# Patient Record
Sex: Female | Born: 1975
Health system: Southern US, Community
[De-identification: ages and names within clinical notes are randomized; demographics above are authoritative.]

## PROBLEM LIST (undated history)

## (undated) DIAGNOSIS — Z8619 Personal history of other infectious and parasitic diseases: Secondary | ICD-10-CM

## (undated) DIAGNOSIS — O24419 Gestational diabetes mellitus in pregnancy, unspecified control: Secondary | ICD-10-CM

## (undated) HISTORY — DX: Personal history of other infectious and parasitic diseases: Z86.19

## (undated) HISTORY — DX: Gestational diabetes mellitus in pregnancy, unspecified control: O24.419

## (undated) HISTORY — PX: WISDOM TOOTH EXTRACTION: SHX21

---

## 2007-05-20 ENCOUNTER — Other Ambulatory Visit: Admission: RE | Admit: 2007-05-20 | Discharge: 2007-05-20 | Payer: Self-pay | Admitting: Family Medicine

## 2008-05-20 ENCOUNTER — Other Ambulatory Visit: Admission: RE | Admit: 2008-05-20 | Discharge: 2008-05-20 | Payer: Self-pay | Admitting: Family Medicine

## 2009-05-13 ENCOUNTER — Other Ambulatory Visit: Admission: RE | Admit: 2009-05-13 | Discharge: 2009-05-13 | Payer: Self-pay | Admitting: Family Medicine

## 2010-04-06 ENCOUNTER — Other Ambulatory Visit: Admission: RE | Admit: 2010-04-06 | Discharge: 2010-04-06 | Payer: Self-pay | Admitting: *Deleted

## 2011-01-18 ENCOUNTER — Encounter: Payer: BC Managed Care – PPO | Attending: Obstetrics and Gynecology

## 2011-01-18 DIAGNOSIS — Z713 Dietary counseling and surveillance: Secondary | ICD-10-CM | POA: Insufficient documentation

## 2011-01-18 DIAGNOSIS — O99810 Abnormal glucose complicating pregnancy: Secondary | ICD-10-CM | POA: Insufficient documentation

## 2011-01-26 ENCOUNTER — Ambulatory Visit: Payer: Self-pay | Admitting: Dietician

## 2011-02-15 ENCOUNTER — Inpatient Hospital Stay (HOSPITAL_COMMUNITY)
Admission: AD | Admit: 2011-02-15 | Discharge: 2011-02-16 | Disposition: A | Discharge status: Home / Self Care | Payer: No Typology Code available for payment source | Source: Ambulatory Visit | Attending: Obstetrics | Admitting: Obstetrics

## 2011-02-15 DIAGNOSIS — O99891 Other specified diseases and conditions complicating pregnancy: Secondary | ICD-10-CM | POA: Insufficient documentation

## 2011-02-15 LAB — KLEIHAUER-BETKE STAIN
# Vials RhIg: 1
Fetal Cells %: 0 %
Quantitation Fetal Hemoglobin: 0 mL

## 2011-02-15 LAB — GLUCOSE, CAPILLARY: Glucose-Capillary: 94 mg/dL (ref 70–99)

## 2011-02-17 LAB — RH IMMUNE GLOBULIN WORKUP (NOT WOMEN'S HOSP)
ABO/RH(D): O NEG
Unit division: 0

## 2011-03-30 ENCOUNTER — Inpatient Hospital Stay (HOSPITAL_COMMUNITY)
Admission: RE | Admit: 2011-03-30 | Discharge: 2011-04-01 | DRG: 372 | Disposition: A | Discharge status: Home / Self Care | Payer: BC Managed Care – PPO | Source: Ambulatory Visit | Attending: Obstetrics and Gynecology | Admitting: Obstetrics and Gynecology

## 2011-03-30 DIAGNOSIS — O99814 Abnormal glucose complicating childbirth: Principal | ICD-10-CM | POA: Diagnosis present

## 2011-03-30 LAB — CBC
Hemoglobin: 11 g/dL — ABNORMAL LOW (ref 12.0–15.0)
MCH: 28 pg (ref 26.0–34.0)
Platelets: 250 10*3/uL (ref 150–400)
RBC: 3.93 MIL/uL (ref 3.87–5.11)
WBC: 8.7 10*3/uL (ref 4.0–10.5)

## 2011-03-30 LAB — GLUCOSE, CAPILLARY
Glucose-Capillary: 89 mg/dL (ref 70–99)
Glucose-Capillary: 81 mg/dL (ref 70–99)
Glucose-Capillary: 206 mg/dL — ABNORMAL HIGH (ref 70–99)

## 2011-03-30 LAB — RPR: RPR Ser Ql: NONREACTIVE

## 2011-03-31 LAB — GLUCOSE, CAPILLARY
Glucose-Capillary: 76 mg/dL (ref 70–99)
Glucose-Capillary: 187 mg/dL — ABNORMAL HIGH (ref 70–99)
Glucose-Capillary: 145 mg/dL — ABNORMAL HIGH (ref 70–99)

## 2011-03-31 LAB — CBC
MCH: 28.3 pg (ref 26.0–34.0)
MCHC: 32.4 g/dL (ref 30.0–36.0)
MCV: 87.4 fL (ref 78.0–100.0)
Platelets: 248 10*3/uL (ref 150–400)
RBC: 3.64 MIL/uL — ABNORMAL LOW (ref 3.87–5.11)

## 2011-04-01 LAB — RH IMMUNE GLOB WKUP(>/=20WKS)(NOT WOMEN'S HOSP)

## 2011-04-01 LAB — GLUCOSE, CAPILLARY: Glucose-Capillary: 81 mg/dL (ref 70–99)

## 2011-04-06 ENCOUNTER — Inpatient Hospital Stay (HOSPITAL_COMMUNITY): Admission: AD | Admit: 2011-04-06 | Payer: Self-pay | Admitting: Obstetrics and Gynecology

## 2011-04-07 NOTE — H&P (Signed)
  NAMEFRANKLIN, CLAPSADDLE NO.:  [id number]  MEDICAL RECORD NO.:  [id number]  LOCATION:  9133                          FACILITY:  [facility]  PHYSICIAN:  [name], M.D.DATE OF BIRTH:  1976/07/08  DATE OF ADMISSION:  03/30/2011 DATE OF DISCHARGE:                             HISTORY & PHYSICAL   CHIEF COMPLAINT:  Class A2 diabetic mellitus, induction of __________.  HISTORY OF PRESENT ILLNESS:  A 36 year old white female with P0 at 63 weeks' gestation.  Glyburide dependent.  Gestational diabetes.  Now 39 weeks for induction of labor with favorable cervix.  She has no known drug allergies.  No known latex allergies.  Medications are prenatal vitamins and glyburide.  SOCIAL HISTORY:  She is a nonsmoker, nondrinker.  She denies domestic or physical violence.  FAMILY HISTORY:  Migraine headaches, heart disease, MI.  This is her first pregnancy.  PHYSICAL EXAMINATION:  GENERAL:  A well-developed and well-nourished white female in no acute distress. HEENT:  Normal. NECK:  Supple.  Full range of motion. LUNGS:  Clear. HEART:  Regular rate and rhythm. ABDOMEN:  Soft, gravid, and nontender.  Estimated fetal weight 7 pounds. Cervix is 2-3 cm, 100% vertex, -1. EXTREMITIES:  There are no cords. NEUROLOGIC:  Nonfocal. SKIN:  Intact.  IMPRESSION: 1. A 39 weeks' intrauterine pregnancy. 2. Class A2 diabetes mellitus, on glyburide, glucose 84 this morning.  PLAN:  To admit, Pitocin, epidural p.r.n., anticipate cautious attempts at vaginal delivery.     [name], M.D.    RJT/MEDQ  D:  03/30/2011  T:  03/31/2011  Job:  [id number]  Electronically Signed by Olivia Mackie M.D. on 04/07/2011 07:37:56 AM

## 2011-04-13 ENCOUNTER — Ambulatory Visit (HOSPITAL_COMMUNITY): Admission: RE | Admit: 2011-04-13 | Payer: BC Managed Care – PPO | Source: Ambulatory Visit

## 2012-05-29 LAB — OB RESULTS CONSOLE ABO/RH: RH Type: NEGATIVE

## 2012-05-29 LAB — OB RESULTS CONSOLE GC/CHLAMYDIA: Chlamydia: NEGATIVE

## 2012-05-29 LAB — OB RESULTS CONSOLE RPR: RPR: NONREACTIVE

## 2012-05-29 LAB — OB RESULTS CONSOLE ANTIBODY SCREEN: Antibody Screen: POSITIVE

## 2012-05-29 LAB — OB RESULTS CONSOLE HIV ANTIBODY (ROUTINE TESTING): HIV: NONREACTIVE

## 2012-05-29 LAB — OB RESULTS CONSOLE RUBELLA ANTIBODY, IGM: Rubella: IMMUNE

## 2012-05-29 LAB — OB RESULTS CONSOLE HEPATITIS B SURFACE ANTIGEN: Hepatitis B Surface Ag: NEGATIVE

## 2012-10-02 NOTE — L&D Delivery Note (Signed)
Delivery Note At  a viable and healthy female was delivered via  (Presentation: LOA  ).  APGAR: 9, 9; weight pending.   Placenta status: spontaneous, intact .  Cord:  with the following complications: none .  Cord pH: na  Anesthesia: epidural  Episiotomy: none Lacerations: second degree Suture Repair: 2.0 vicryl rapide Est. Blood Loss (mL): 300  Mom to postpartum.  Baby to nursery-stable.  Osvaldo Lamping J 12/25/2012, 4:04 PM

## 2012-11-26 LAB — OB RESULTS CONSOLE GBS: GBS: POSITIVE

## 2012-12-12 ENCOUNTER — Other Ambulatory Visit: Payer: Self-pay | Admitting: Obstetrics and Gynecology

## 2012-12-17 ENCOUNTER — Telehealth (HOSPITAL_COMMUNITY): Payer: Self-pay | Admitting: *Deleted

## 2012-12-17 ENCOUNTER — Encounter (HOSPITAL_COMMUNITY): Payer: Self-pay | Admitting: *Deleted

## 2012-12-17 NOTE — Telephone Encounter (Signed)
Preadmission screen  

## 2012-12-21 ENCOUNTER — Encounter (HOSPITAL_COMMUNITY): Payer: Self-pay | Admitting: *Deleted

## 2012-12-21 ENCOUNTER — Inpatient Hospital Stay (HOSPITAL_COMMUNITY)
Admission: AD | Admit: 2012-12-21 | Discharge: 2012-12-21 | Disposition: A | Discharge status: Home / Self Care | Payer: 59 | Source: Ambulatory Visit | Attending: Obstetrics and Gynecology | Admitting: Obstetrics and Gynecology

## 2012-12-21 DIAGNOSIS — O99810 Abnormal glucose complicating pregnancy: Secondary | ICD-10-CM | POA: Insufficient documentation

## 2012-12-21 DIAGNOSIS — O368190 Decreased fetal movements, unspecified trimester, not applicable or unspecified: Secondary | ICD-10-CM | POA: Insufficient documentation

## 2012-12-21 NOTE — Progress Notes (Signed)
Written and verbal d/c instructions given and understanding voiced. 

## 2012-12-21 NOTE — MAU Provider Note (Signed)
  History   Decreased FM and ? contractions  CSN: [id number]  Arrival date and time: 12/21/12 0230   None     Chief Complaint  Patient presents with  . Decreased Fetal Movement   HPI  OB History   Grav Para Term Preterm Abortions TAB SAB Ect Mult Living   2 1 1       1       Past Medical History  Diagnosis Date  . Hx of varicella   . Gestational diabetes     Past Surgical History  Procedure Laterality Date  . Wisdom tooth extraction      Family History  Problem Relation Age of Onset  . Heart murmur Mother   . Heart disease Mother   . Migraines Mother   . Alcohol abuse Father   . Dementia Maternal Grandmother   . Heart disease Maternal Grandfather   . Heart attack Maternal Grandfather   . Cancer Paternal Grandmother     skin and leukemia    History  Substance Use Topics  . Smoking status: Never Smoker   . Smokeless tobacco: Never Used  . Alcohol Use: No    Allergies: No Known Allergies  No prescriptions prior to admission    ROS Physical Exam VE: per Rn No change   Blood pressure 106/71, pulse 74, temperature 96.6 F (35.9 C), temperature source Oral, resp. rate 20, height 5\' 3"  (1.6 m), weight 72.303 kg (159 lb 6.4 oz), last menstrual period 03/23/2012.  Physical Exam  MAU Course  Procedures NST reactive, category I, no decels, rare contractions.  MDM na  Assessment and Plan  39 weeks Decreased FM with reactive NST GDM- stable DC home FAC and labor precautions.  Karen Ortiz J 12/21/2012, 7:57 AM

## 2012-12-21 NOTE — Treatment Plan (Signed)
Dr. Billy Coast notified of pt. Complaint, fetal heart tracing, gestational age and blood glucose levels. Orders received for SVE and if still 2-3 can go home.

## 2012-12-21 NOTE — MAU Note (Signed)
Pt. 39.0 weeks here complaining of noted decreased fetal movement and an elevation in blood glucose. Woke up around 1am and noticed a decrease in baby movement then checked her bs and it was 131 at that time. Pt. Rechecked it later and it was 117. Then rechecked it later and it 99.

## 2012-12-25 ENCOUNTER — Inpatient Hospital Stay (HOSPITAL_COMMUNITY)
Admission: RE | Admit: 2012-12-25 | Discharge: 2012-12-27 | DRG: 775 | Disposition: A | Discharge status: Home / Self Care | Payer: 59 | Source: Ambulatory Visit | Attending: Obstetrics and Gynecology | Admitting: Obstetrics and Gynecology

## 2012-12-25 ENCOUNTER — Encounter (HOSPITAL_COMMUNITY): Payer: Self-pay | Admitting: Anesthesiology

## 2012-12-25 ENCOUNTER — Inpatient Hospital Stay (HOSPITAL_COMMUNITY): Payer: 59 | Admitting: Anesthesiology

## 2012-12-25 ENCOUNTER — Encounter (HOSPITAL_COMMUNITY): Payer: Self-pay

## 2012-12-25 DIAGNOSIS — O09529 Supervision of elderly multigravida, unspecified trimester: Secondary | ICD-10-CM | POA: Diagnosis present

## 2012-12-25 DIAGNOSIS — D649 Anemia, unspecified: Secondary | ICD-10-CM | POA: Diagnosis not present

## 2012-12-25 DIAGNOSIS — J329 Chronic sinusitis, unspecified: Secondary | ICD-10-CM | POA: Diagnosis present

## 2012-12-25 DIAGNOSIS — O9903 Anemia complicating the puerperium: Secondary | ICD-10-CM | POA: Diagnosis not present

## 2012-12-25 DIAGNOSIS — O99814 Abnormal glucose complicating childbirth: Principal | ICD-10-CM | POA: Diagnosis present

## 2012-12-25 DIAGNOSIS — O99892 Other specified diseases and conditions complicating childbirth: Secondary | ICD-10-CM | POA: Diagnosis present

## 2012-12-25 LAB — RPR: RPR Ser Ql: NONREACTIVE

## 2012-12-25 LAB — CBC
HCT: 30.5 % — ABNORMAL LOW (ref 36.0–46.0)
Hemoglobin: 10.2 g/dL — ABNORMAL LOW (ref 12.0–15.0)
WBC: 7.1 10*3/uL (ref 4.0–10.5)

## 2012-12-25 LAB — GLUCOSE, CAPILLARY: Glucose-Capillary: 64 mg/dL — ABNORMAL LOW (ref 70–99)

## 2012-12-25 LAB — GLUCOSE, RANDOM: Glucose, Bld: 106 mg/dL — ABNORMAL HIGH (ref 70–99)

## 2012-12-25 MED ORDER — DIBUCAINE 1 % RE OINT: 1.0000 "application " | TOPICAL_OINTMENT | RECTAL | Status: DC | PRN

## 2012-12-25 MED ORDER — OXYCODONE-ACETAMINOPHEN 5-325 MG PO TABS: 1.0000 | ORAL_TABLET | ORAL | Status: DC | PRN

## 2012-12-25 MED ORDER — BENZOCAINE-MENTHOL 20-0.5 % EX AERO
1.0000 "application " | INHALATION_SPRAY | CUTANEOUS | Status: DC | PRN
  Administered 2012-12-25: 1 via TOPICAL
  Filled 2012-12-25: qty 56

## 2012-12-25 MED ORDER — EPHEDRINE 5 MG/ML INJ
10.0000 mg | INTRAVENOUS | Status: DC | PRN
  Filled 2012-12-25: qty 2

## 2012-12-25 MED ORDER — LIDOCAINE HCL (PF) 1 % IJ SOLN
30.0000 mL | INTRAMUSCULAR | Status: DC | PRN
  Filled 2012-12-25: qty 30

## 2012-12-25 MED ORDER — PENICILLIN G POTASSIUM 5000000 UNITS IJ SOLR
5.0000 10*6.[IU] | Freq: Once | INTRAVENOUS | Status: DC
  Filled 2012-12-25: qty 5

## 2012-12-25 MED ORDER — PENICILLIN G POTASSIUM 5000000 UNITS IJ SOLR
2.5000 10*6.[IU] | INTRAVENOUS | Status: DC
  Filled 2012-12-25 (×2): qty 2.5

## 2012-12-25 MED ORDER — ONDANSETRON HCL 4 MG/2ML IJ SOLN: 4.0000 mg | INTRAMUSCULAR | Status: DC | PRN

## 2012-12-25 MED ORDER — LACTATED RINGERS IV SOLN: 500.0000 mL | Freq: Once | INTRAVENOUS | Status: DC

## 2012-12-25 MED ORDER — CITRIC ACID-SODIUM CITRATE 334-500 MG/5ML PO SOLN: 30.0000 mL | ORAL | Status: DC | PRN

## 2012-12-25 MED ORDER — TERBUTALINE SULFATE 1 MG/ML IJ SOLN: 0.2500 mg | Freq: Once | INTRAMUSCULAR | Status: DC | PRN

## 2012-12-25 MED ORDER — EPHEDRINE 5 MG/ML INJ
10.0000 mg | INTRAVENOUS | Status: DC | PRN
  Filled 2012-12-25: qty 2
  Filled 2012-12-25: qty 4

## 2012-12-25 MED ORDER — ONDANSETRON HCL 4 MG PO TABS: 4.0000 mg | ORAL_TABLET | ORAL | Status: DC | PRN

## 2012-12-25 MED ORDER — OXYTOCIN BOLUS FROM INFUSION: 500.0000 mL | INTRAVENOUS | Status: DC

## 2012-12-25 MED ORDER — PENICILLIN G POTASSIUM 5000000 UNITS IJ SOLR
5.0000 10*6.[IU] | Freq: Once | INTRAVENOUS | Status: AC
  Administered 2012-12-25: 5 10*6.[IU] via INTRAVENOUS
  Filled 2012-12-25: qty 5

## 2012-12-25 MED ORDER — ZOLPIDEM TARTRATE 5 MG PO TABS: 5.0000 mg | ORAL_TABLET | Freq: Every evening | ORAL | Status: DC | PRN

## 2012-12-25 MED ORDER — OXYTOCIN 40 UNITS IN LACTATED RINGERS INFUSION - SIMPLE MED
1.0000 m[IU]/min | INTRAVENOUS | Status: DC
  Administered 2012-12-25: 12 m[IU]/min via INTRAVENOUS
  Administered 2012-12-25: 2 m[IU]/min via INTRAVENOUS
  Filled 2012-12-25: qty 1000

## 2012-12-25 MED ORDER — SENNOSIDES-DOCUSATE SODIUM 8.6-50 MG PO TABS
2.0000 | ORAL_TABLET | Freq: Every day | ORAL | Status: DC
  Administered 2012-12-25 – 2012-12-26 (×2): 2 via ORAL

## 2012-12-25 MED ORDER — TETANUS-DIPHTH-ACELL PERTUSSIS 5-2.5-18.5 LF-MCG/0.5 IM SUSP
0.5000 mL | Freq: Once | INTRAMUSCULAR | Status: AC
  Administered 2012-12-27: 0.5 mL via INTRAMUSCULAR
  Filled 2012-12-25 (×2): qty 0.5

## 2012-12-25 MED ORDER — LACTATED RINGERS IV SOLN
INTRAVENOUS | Status: DC
  Administered 2012-12-25: 900 mL via INTRAVENOUS
  Administered 2012-12-25: 1000 mL via INTRAVENOUS

## 2012-12-25 MED ORDER — PHENYLEPHRINE 40 MCG/ML (10ML) SYRINGE FOR IV PUSH (FOR BLOOD PRESSURE SUPPORT)
80.0000 ug | PREFILLED_SYRINGE | INTRAVENOUS | Status: DC | PRN
  Filled 2012-12-25: qty 2

## 2012-12-25 MED ORDER — BUTORPHANOL TARTRATE 1 MG/ML IJ SOLN: 1.0000 mg | INTRAMUSCULAR | Status: DC | PRN

## 2012-12-25 MED ORDER — FLEET ENEMA 7-19 GM/118ML RE ENEM: 1.0000 | ENEMA | RECTAL | Status: DC | PRN

## 2012-12-25 MED ORDER — SIMETHICONE 80 MG PO CHEW: 80.0000 mg | CHEWABLE_TABLET | ORAL | Status: DC | PRN

## 2012-12-25 MED ORDER — DIPHENHYDRAMINE HCL 25 MG PO CAPS: 25.0000 mg | ORAL_CAPSULE | Freq: Four times a day (QID) | ORAL | Status: DC | PRN

## 2012-12-25 MED ORDER — PHENYLEPHRINE 40 MCG/ML (10ML) SYRINGE FOR IV PUSH (FOR BLOOD PRESSURE SUPPORT)
80.0000 ug | PREFILLED_SYRINGE | INTRAVENOUS | Status: DC | PRN
  Filled 2012-12-25: qty 2
  Filled 2012-12-25: qty 5

## 2012-12-25 MED ORDER — LANOLIN HYDROUS EX OINT: TOPICAL_OINTMENT | CUTANEOUS | Status: DC | PRN

## 2012-12-25 MED ORDER — SODIUM BICARBONATE 8.4 % IV SOLN
INTRAVENOUS | Status: DC | PRN
  Administered 2012-12-25: 5 mL via EPIDURAL

## 2012-12-25 MED ORDER — ONDANSETRON HCL 4 MG/2ML IJ SOLN: 4.0000 mg | Freq: Four times a day (QID) | INTRAMUSCULAR | Status: DC | PRN

## 2012-12-25 MED ORDER — PENICILLIN G POTASSIUM 5000000 UNITS IJ SOLR
2.5000 10*6.[IU] | INTRAVENOUS | Status: DC
  Administered 2012-12-25: 2.5 10*6.[IU] via INTRAVENOUS
  Filled 2012-12-25 (×5): qty 2.5

## 2012-12-25 MED ORDER — METHYLERGONOVINE MALEATE 0.2 MG PO TABS: 0.2000 mg | ORAL_TABLET | ORAL | Status: DC | PRN

## 2012-12-25 MED ORDER — PRENATAL MULTIVITAMIN CH
1.0000 | ORAL_TABLET | Freq: Every day | ORAL | Status: DC
  Administered 2012-12-26: 1 via ORAL
  Filled 2012-12-25: qty 1

## 2012-12-25 MED ORDER — DIPHENHYDRAMINE HCL 50 MG/ML IJ SOLN: 12.5000 mg | INTRAMUSCULAR | Status: DC | PRN

## 2012-12-25 MED ORDER — IBUPROFEN 600 MG PO TABS
600.0000 mg | ORAL_TABLET | Freq: Four times a day (QID) | ORAL | Status: DC
  Administered 2012-12-25 – 2012-12-27 (×7): 600 mg via ORAL
  Filled 2012-12-25 (×6): qty 1

## 2012-12-25 MED ORDER — FENTANYL 2.5 MCG/ML BUPIVACAINE 1/10 % EPIDURAL INFUSION (WH - ANES)
14.0000 mL/h | INTRAMUSCULAR | Status: DC | PRN
  Administered 2012-12-25: 14 mL/h via EPIDURAL
  Filled 2012-12-25: qty 125

## 2012-12-25 MED ORDER — WITCH HAZEL-GLYCERIN EX PADS: 1.0000 "application " | MEDICATED_PAD | CUTANEOUS | Status: DC | PRN

## 2012-12-25 MED ORDER — ACETAMINOPHEN 325 MG PO TABS: 650.0000 mg | ORAL_TABLET | ORAL | Status: DC | PRN

## 2012-12-25 MED ORDER — LACTATED RINGERS IV SOLN
500.0000 mL | INTRAVENOUS | Status: DC | PRN
  Administered 2012-12-25: 1000 mL via INTRAVENOUS

## 2012-12-25 MED ORDER — METHYLERGONOVINE MALEATE 0.2 MG/ML IJ SOLN: 0.2000 mg | INTRAMUSCULAR | Status: DC | PRN

## 2012-12-25 MED ORDER — IBUPROFEN 600 MG PO TABS: 600.0000 mg | ORAL_TABLET | Freq: Four times a day (QID) | ORAL | Status: DC | PRN

## 2012-12-25 MED ORDER — OXYCODONE-ACETAMINOPHEN 5-325 MG PO TABS
1.0000 | ORAL_TABLET | ORAL | Status: DC | PRN
  Administered 2012-12-25 – 2012-12-26 (×2): 1 via ORAL
  Filled 2012-12-25 (×2): qty 1

## 2012-12-25 MED ORDER — OXYTOCIN 40 UNITS IN LACTATED RINGERS INFUSION - SIMPLE MED: 62.5000 mL/h | INTRAVENOUS | Status: DC

## 2012-12-25 NOTE — Anesthesia Preprocedure Evaluation (Signed)
Anesthesia Evaluation  Patient identified by MRN, date of birth, ID band Patient awake    Reviewed: Allergy & Precautions, H&P , Patient's Chart, lab work & pertinent test results  Airway Mallampati: II TM Distance: >3 FB Neck ROM: full    Dental  (+) Teeth Intact   Pulmonary  breath sounds clear to auscultation        Cardiovascular Rhythm:regular Rate:Normal     Neuro/Psych    GI/Hepatic   Endo/Other  diabetes, Gestational  Renal/GU      Musculoskeletal   Abdominal   Peds  Hematology   Anesthesia Other Findings       Reproductive/Obstetrics (+) Pregnancy                           Anesthesia Physical Anesthesia Plan  ASA: III  Anesthesia Plan: Epidural   Post-op Pain Management:    Induction:   Airway Management Planned:   Additional Equipment:   Intra-op Plan:   Post-operative Plan:   Informed Consent:   Plan Discussed with:   Anesthesia Plan Comments:         Anesthesia Quick Evaluation

## 2012-12-25 NOTE — Progress Notes (Signed)
Karen Ortiz is a 37 y.o. G2P1001 at [redacted]w[redacted]d by LMP admitted for induction of labor due to Gestational diabetes.  Subjective: comfortable  Objective: LMP 03/23/2012      FHT:  FHR: 155 bpm, variability: moderate,  accelerations:  Present,  decelerations:  Absent UC:   irregular, every 10 minutes SVE:    3/70/-1 AROM- clear  Labs: Lab Results  Component Value Date   WBC 14.9* 03/31/2011   HGB 10.3* 03/31/2011   HCT 31.8* 03/31/2011   MCV 87.4 03/31/2011   PLT 248 03/31/2011    Assessment / Plan: Induction of labor due to gestational diabetes,  progressing well on pitocin  Labor: Progressing on Pitocin, will continue to increase then AROM Preeclampsia:  na Fetal Wellbeing:  Category I Pain Control:  Labor support without medications I/D:  n/a Anticipated MOD:  NSVD BS pending  Sayler Mickiewicz J 12/25/2012, 7:14 AM

## 2012-12-25 NOTE — Anesthesia Procedure Notes (Signed)

## 2012-12-25 NOTE — Progress Notes (Signed)
Karen Ortiz is a 37 y.o. G2P1001 at [redacted]w[redacted]d by LMP admitted for induction of labor due to Gestational diabetes.  Subjective: Feels pressure  Objective: BP 102/59  Pulse 64  Temp(Src) 98.1 F (36.7 C) (Oral)  Resp 18  Ht 5\' 3"  (1.6 m)  Wt 72.122 kg (159 lb)  BMI 28.17 kg/m2  SpO2 100%  LMP 03/23/2012      FHT:  FHR: 155 bpm, variability: moderate,  accelerations:  Present,  decelerations:  Absent UC:   irregular, every 4 minutes SVE:   Dilation: 10 Effacement (%): 100 Station: +3 Exam by:: Dr Billy Coast BS 66  Labs: Lab Results  Component Value Date   WBC 7.1 12/25/2012   HGB 10.2* 12/25/2012   HCT 30.5* 12/25/2012   MCV 87.9 12/25/2012   PLT 208 12/25/2012    Assessment / Plan: Induction of labor due to gestational diabetes,  progressing well on pitocin  Labor: Progressing normally Preeclampsia:  na Fetal Wellbeing:  Category I Pain Control:  Epidural I/D:  n/a Anticipated MOD:  NSVD  Karen Ortiz J 12/25/2012, 3:30 PM

## 2012-12-26 LAB — CBC
Hemoglobin: 9.7 g/dL — ABNORMAL LOW (ref 12.0–15.0)
MCH: 29.1 pg (ref 26.0–34.0)
MCV: 88 fL (ref 78.0–100.0)
RBC: 3.33 MIL/uL — ABNORMAL LOW (ref 3.87–5.11)

## 2012-12-26 MED ORDER — RHO D IMMUNE GLOBULIN 1500 UNIT/2ML IJ SOLN
300.0000 ug | Freq: Once | INTRAMUSCULAR | Status: AC
  Administered 2012-12-26: 300 ug via INTRAMUSCULAR
  Filled 2012-12-26: qty 2

## 2012-12-26 NOTE — Progress Notes (Signed)
Patient ID: Karen Ortiz, female   DOB: 1976-02-05, 37 y.o.   MRN: [id number] PPD # 1 S/P SVD w/ 2nd deg repair  Subjective: Pt reports feeling well/ Pain controlled with ibuprofen Tolerating po/ Voiding without problems/ No n/v Bleeding is light Newborn info:  Information for the patient's newborn:  Karen, Ortiz [[id number]]  female  / circ planned for today, per Dr Billy Coast Feeding: breast   Objective:  VS: Blood pressure 116/58, pulse 72, temperature 98.1 F (36.7 C), temperature source Oral, resp. rate 20.    Recent Labs  12/25/12 0745 12/26/12 0630  WBC 7.1 12.1*  HGB 10.2* 9.7*  HCT 30.5* 29.3*  PLT 208 207    Blood type: O/Negative/-- (08/28 0000)   Infant: O Neg Rubella: Immune (08/28 0000)    Physical Exam:  General:  alert, cooperative and no distress CV: Regular rate and rhythm Resp: clear Abdomen: soft, nontender, normal bowel sounds Uterine Fundus: firm, below umbilicus, nontender Perineum: healing with good reapproximation Lochia: minimal Ext: edema trace to +1 and Homans sign is negative, no sign of DVT   A/P: PPD # 1/ G3P2002/ S/P:  SVD w/ 2nd deg repair Hx GDM A1, well controlled, resolving Doing well Continue routine post partum orders Anticipate D/C home in AM    Karen Revel, MSN, [facility] 12/26/2012, 9:27 AM

## 2012-12-26 NOTE — H&P (Signed)
Karen Ortiz, Karen Ortiz NO.:  [id number]  MEDICAL RECORD NO.:  [id number]  LOCATION:  9108                          FACILITY:  [facility]  PHYSICIAN:  [name], M.D.DATE OF BIRTH:  Aug 18, 1976  DATE OF ADMISSION:  12/25/2012 DATE OF DISCHARGE:                             HISTORY & PHYSICAL   CHIEF COMPLAINT:  Gestational diabetes for induction at 39-1/2 weeks.  She is a 37 year old white female, G2, P1, at 39 weeks and 4/7th days with well controlled gestational diabetes for induction at term.  She has no known drug allergies.  MEDICATIONS:  Prenatal vitamins.  She is a nonsmoker, nondrinker.  She denies domestic or physical violence.  Her social history is otherwise noncontributory.  Her pregnancy complicated by gestational diabetes, which was diet controlled.  She has a family history of migraine headaches, dementia, heart disease, and heart murmur.  She had a previous vaginal delivery of a 6 pounds 6 ounces female.  She had wisdom tooth extraction and has not had any surgical procedure.  PHYSICAL EXAMINATION:  GENERAL:  She is a well-developed, well- nourished, white female, in no acute distress. HEENT:  Normal. NECK:  Supple.  Full range of motion. LUNGS:  Clear. HEART:  Regular rhythm. ABDOMEN:  Soft, gravid, nontender.  Estimated fetal weight 7 pounds. Cervix is 3-4 cm, 80%, vertex, -1. EXTREMITIES:  There are no cords. NEUROLOGIC:  Nonfocal. SKIN:  Intact.  IMPRESSION:  A 39-week and 4/7th day intrauterine gestation with gestational diabetes, well controlled on diet for induction.  PLAN:  Pitocin and epidural as needed.  Anticipate attempts at vaginal delivery.     [name], M.D.     RJT/MEDQ  D:  12/25/2012  T:  12/26/2012  Job:  [id number]

## 2012-12-27 LAB — RH IG WORKUP (INCLUDES ABO/RH)
Antibody Screen: POSITIVE
Fetal Screen: NEGATIVE
Gestational Age(Wks): 39.4

## 2012-12-27 MED ORDER — IBUPROFEN 600 MG PO TABS: 600.0000 mg | ORAL_TABLET | Freq: Four times a day (QID) | ORAL | Status: AC

## 2012-12-27 MED ORDER — AZITHROMYCIN 250 MG PO TABS: ORAL_TABLET | ORAL | Status: DC

## 2012-12-27 MED ORDER — POLYSACCHARIDE IRON COMPLEX 150 MG PO CAPS: 150.0000 mg | ORAL_CAPSULE | Freq: Two times a day (BID) | ORAL | Status: DC

## 2012-12-27 NOTE — Progress Notes (Signed)
PPD 2 SVD  S:  Reports feeling stressed out today - spouse sick with GI virus             URI "cold" for 2 weeks - worse in past 3 days / green drainage and feels miserable / requesting Zpak             Tolerating po/ No nausea or vomiting             Bleeding is light             Pain controlled with motrin only             Up ad lib / ambulatory / voiding QS  Newborn breast feeding  / Circumcision done O:               VS: BP 92/52  Pulse 49  Temp(Src) 97.7 F (36.5 C) (Oral)  Resp 18  Ht 5\' 3"  (1.6 m)  Wt 72.122 kg (159 lb)  BMI 28.17 kg/m2  SpO2 100%  LMP 03/23/2012   LABS:  Recent Labs  12/25/12 0745 12/26/12 0630  WBC 7.1 12.1*  HGB 10.2* 9.7*  PLT 208 207                   Physical Exam:             Alert and oriented X3  Lungs: Clear and unlabored  Heart: regular rate and rhythm / no mumurs  Abdomen: soft, non-tender, non-distended              Fundus: firm, non-tender, U-2  Perineum: no edema  Lochia: light  Extremities: no edema, no calf pain or tenderness    A: PPD # 2              URI - likely sinusitis             IDA anemia  Doing well - stable status  P:  Routine post partum orders             z-pak / Childrens Dimetapp (combination decongestant and antihistamine) / Muccinex / increase water hydration  Discharge to home - WOB booklet / instructions reviewed  Marlinda Mike CNM, MSN 12/27/2012, 10:32 AM

## 2012-12-27 NOTE — Discharge Summary (Signed)
Obstetric Discharge Summary  Reason for Admission: induction of labor- GDMA1 Prenatal Procedures: none Intrapartum Procedures: spontaneous vaginal delivery and GBS prophylaxis Postpartum Procedures: none Complications-Operative and Postpartum: 2nd degree perineal laceration Hemoglobin  Date Value Range Status  12/26/2012 9.7* 12.0 - 15.0 g/dL Final     HCT  Date Value Range Status  12/26/2012 29.3* 36.0 - 46.0 % Final    Physical Exam:  General: alert, cooperative and no distress Lochia: appropriate Uterine Fundus: firm Incision: healing well DVT Evaluation: No evidence of DVT seen on physical exam.  Discharge Diagnoses: Term Pregnancy-delivered and GDMA1-delivered and IDA anemia of pregnancy-delivered  Discharge Information: Date: 12/27/2012 Activity: pelvic rest Diet: routine Medications: PNV, Ibuprofen and Iron Condition: stable Instructions: refer to practice specific booklet Discharge to: home Follow-up Information   Follow up with Karen Aden, MD. Schedule an appointment as soon as possible for a visit in 6 weeks.   Contact information:   Karen Ortiz Karen Ortiz [place] [address] [phone]       SUGAR test for diabetes outside of pregnancy at 6-12 weeks postpartum  Newborn Data: Live born female  Birth Weight: 6 lb 14 oz (3118 g) APGAR: 9,   Home with mother.  Karen Ortiz 12/27/2012, 10:38 AM

## 2012-12-31 ENCOUNTER — Encounter (HOSPITAL_COMMUNITY): Payer: Self-pay

## 2014-01-12 ENCOUNTER — Ambulatory Visit: Payer: Self-pay | Admitting: Podiatry

## 2014-01-22 ENCOUNTER — Ambulatory Visit (INDEPENDENT_AMBULATORY_CARE_PROVIDER_SITE_OTHER): Payer: Managed Care, Other (non HMO)

## 2014-01-22 ENCOUNTER — Encounter: Payer: Self-pay | Admitting: Podiatry

## 2014-01-22 ENCOUNTER — Ambulatory Visit (INDEPENDENT_AMBULATORY_CARE_PROVIDER_SITE_OTHER): Payer: Managed Care, Other (non HMO) | Admitting: Podiatry

## 2014-01-22 VITALS — BP 102/68 | HR 62 | Resp 16 | Ht 64.0 in | Wt 145.0 lb

## 2014-01-22 DIAGNOSIS — Q828 Other specified congenital malformations of skin: Secondary | ICD-10-CM

## 2014-01-22 DIAGNOSIS — M216X9 Other acquired deformities of unspecified foot: Secondary | ICD-10-CM

## 2014-01-22 DIAGNOSIS — M21619 Bunion of unspecified foot: Secondary | ICD-10-CM

## 2014-01-22 NOTE — Progress Notes (Signed)
Subjective:     Patient ID: Karen Ortiz, female   DOB: 06/10/1976, 38 y.o.   MRN: [id number]  HPI patient presents with plantar lesions on both feet that she is concerned about and states that they can and time become painful and bother her. She does not remember any specific change or injury and has had 2 children and the last several years   Review of Systems  All other systems reviewed and are negative.      Objective:   Physical Exam  Nursing note and vitals reviewed. Constitutional: She is oriented to person, place, and time.  Cardiovascular: Intact distal pulses.   Musculoskeletal: Normal range of motion.  Neurological: She is oriented to person, place, and time.  Skin: Skin is warm.   neurovascular status intact with muscle strength adequate and patient found to have keratotic lesion subsecond metatarsal of both feet with lucent cores. Range of motion within normal limits and digits were found to be well perfused with mild discomfort upon palpation noted     Assessment:     Porokeratotic type lesions plantar aspect both feet secondary to foot structure and keratotic lesions around fifth metatarsal heads of both feet    Plan:     H&P and x-rays reviewed. Debrided lesions on both feet and instructed on home treatment herself and if symptoms were to get worse she will reappoint for retreatment

## 2014-01-22 NOTE — Progress Notes (Signed)
   Subjective:    Patient ID: Karen Ortiz, female    DOB: September 04, 1976, 38 y.o.   MRN: [id number]  HPI Comments: i have these spots on the bottom of my feet, sometime is can be discomforting , they have been there for years     Review of Systems     Objective:   Physical Exam        Assessment & Plan:

## 2014-08-03 ENCOUNTER — Encounter: Payer: Self-pay | Admitting: Podiatry

## 2016-02-25 ENCOUNTER — Other Ambulatory Visit: Payer: Self-pay | Admitting: Family Medicine

## 2016-02-25 DIAGNOSIS — Z1231 Encounter for screening mammogram for malignant neoplasm of breast: Secondary | ICD-10-CM

## 2016-04-11 ENCOUNTER — Ambulatory Visit: Payer: Self-pay

## 2016-04-12 ENCOUNTER — Ambulatory Visit
Admission: RE | Admit: 2016-04-12 | Discharge: 2016-04-12 | Disposition: A | Discharge status: Home / Self Care | Payer: BLUE CROSS/BLUE SHIELD | Source: Ambulatory Visit | Attending: Family Medicine | Admitting: Family Medicine

## 2016-04-12 DIAGNOSIS — Z1231 Encounter for screening mammogram for malignant neoplasm of breast: Secondary | ICD-10-CM

## 2016-05-02 DIAGNOSIS — Z23 Encounter for immunization: Secondary | ICD-10-CM | POA: Diagnosis not present

## 2016-05-08 DIAGNOSIS — I83893 Varicose veins of bilateral lower extremities with other complications: Secondary | ICD-10-CM | POA: Diagnosis not present

## 2016-05-08 DIAGNOSIS — I83813 Varicose veins of bilateral lower extremities with pain: Secondary | ICD-10-CM | POA: Diagnosis not present

## 2016-05-23 DIAGNOSIS — F331 Major depressive disorder, recurrent, moderate: Secondary | ICD-10-CM | POA: Diagnosis not present

## 2016-05-24 DIAGNOSIS — R0982 Postnasal drip: Secondary | ICD-10-CM | POA: Diagnosis not present

## 2016-05-24 DIAGNOSIS — K219 Gastro-esophageal reflux disease without esophagitis: Secondary | ICD-10-CM | POA: Insufficient documentation

## 2016-05-24 DIAGNOSIS — J3089 Other allergic rhinitis: Secondary | ICD-10-CM | POA: Diagnosis not present

## 2016-05-26 DIAGNOSIS — F331 Major depressive disorder, recurrent, moderate: Secondary | ICD-10-CM | POA: Diagnosis not present

## 2016-05-26 DIAGNOSIS — F411 Generalized anxiety disorder: Secondary | ICD-10-CM | POA: Diagnosis not present

## 2016-05-26 DIAGNOSIS — F902 Attention-deficit hyperactivity disorder, combined type: Secondary | ICD-10-CM | POA: Diagnosis not present

## 2016-05-30 DIAGNOSIS — F411 Generalized anxiety disorder: Secondary | ICD-10-CM | POA: Diagnosis not present

## 2016-05-30 DIAGNOSIS — F331 Major depressive disorder, recurrent, moderate: Secondary | ICD-10-CM | POA: Diagnosis not present

## 2016-05-30 DIAGNOSIS — F902 Attention-deficit hyperactivity disorder, combined type: Secondary | ICD-10-CM | POA: Diagnosis not present

## 2016-06-06 DIAGNOSIS — I83893 Varicose veins of bilateral lower extremities with other complications: Secondary | ICD-10-CM | POA: Diagnosis not present

## 2016-06-06 DIAGNOSIS — I83813 Varicose veins of bilateral lower extremities with pain: Secondary | ICD-10-CM | POA: Diagnosis not present

## 2016-06-08 DIAGNOSIS — F331 Major depressive disorder, recurrent, moderate: Secondary | ICD-10-CM | POA: Diagnosis not present

## 2016-06-08 DIAGNOSIS — Z23 Encounter for immunization: Secondary | ICD-10-CM | POA: Diagnosis not present

## 2016-06-08 DIAGNOSIS — R5383 Other fatigue: Secondary | ICD-10-CM | POA: Diagnosis not present

## 2016-06-08 DIAGNOSIS — F411 Generalized anxiety disorder: Secondary | ICD-10-CM | POA: Diagnosis not present

## 2016-06-12 DIAGNOSIS — F331 Major depressive disorder, recurrent, moderate: Secondary | ICD-10-CM | POA: Diagnosis not present

## 2016-06-12 DIAGNOSIS — F411 Generalized anxiety disorder: Secondary | ICD-10-CM | POA: Diagnosis not present

## 2016-06-12 DIAGNOSIS — B079 Viral wart, unspecified: Secondary | ICD-10-CM | POA: Diagnosis not present

## 2016-06-12 DIAGNOSIS — F902 Attention-deficit hyperactivity disorder, combined type: Secondary | ICD-10-CM | POA: Diagnosis not present

## 2016-06-12 DIAGNOSIS — L409 Psoriasis, unspecified: Secondary | ICD-10-CM | POA: Diagnosis not present

## 2016-06-23 DIAGNOSIS — F902 Attention-deficit hyperactivity disorder, combined type: Secondary | ICD-10-CM | POA: Diagnosis not present

## 2016-06-23 DIAGNOSIS — F411 Generalized anxiety disorder: Secondary | ICD-10-CM | POA: Diagnosis not present

## 2016-06-23 DIAGNOSIS — F331 Major depressive disorder, recurrent, moderate: Secondary | ICD-10-CM | POA: Diagnosis not present

## 2016-06-26 DIAGNOSIS — I83813 Varicose veins of bilateral lower extremities with pain: Secondary | ICD-10-CM | POA: Diagnosis not present

## 2016-06-26 DIAGNOSIS — I83891 Varicose veins of right lower extremities with other complications: Secondary | ICD-10-CM | POA: Diagnosis not present

## 2016-06-27 DIAGNOSIS — F331 Major depressive disorder, recurrent, moderate: Secondary | ICD-10-CM | POA: Diagnosis not present

## 2016-06-27 DIAGNOSIS — F902 Attention-deficit hyperactivity disorder, combined type: Secondary | ICD-10-CM | POA: Diagnosis not present

## 2016-06-27 DIAGNOSIS — F411 Generalized anxiety disorder: Secondary | ICD-10-CM | POA: Diagnosis not present

## 2016-07-10 DIAGNOSIS — F411 Generalized anxiety disorder: Secondary | ICD-10-CM | POA: Diagnosis not present

## 2016-07-10 DIAGNOSIS — F902 Attention-deficit hyperactivity disorder, combined type: Secondary | ICD-10-CM | POA: Diagnosis not present

## 2016-07-10 DIAGNOSIS — F331 Major depressive disorder, recurrent, moderate: Secondary | ICD-10-CM | POA: Diagnosis not present

## 2016-07-18 DIAGNOSIS — F331 Major depressive disorder, recurrent, moderate: Secondary | ICD-10-CM | POA: Diagnosis not present

## 2016-07-18 DIAGNOSIS — F902 Attention-deficit hyperactivity disorder, combined type: Secondary | ICD-10-CM | POA: Diagnosis not present

## 2016-07-18 DIAGNOSIS — F411 Generalized anxiety disorder: Secondary | ICD-10-CM | POA: Diagnosis not present

## 2016-07-24 DIAGNOSIS — F902 Attention-deficit hyperactivity disorder, combined type: Secondary | ICD-10-CM | POA: Diagnosis not present

## 2016-07-24 DIAGNOSIS — F411 Generalized anxiety disorder: Secondary | ICD-10-CM | POA: Diagnosis not present

## 2016-07-24 DIAGNOSIS — F331 Major depressive disorder, recurrent, moderate: Secondary | ICD-10-CM | POA: Diagnosis not present

## 2016-07-25 DIAGNOSIS — R070 Pain in throat: Secondary | ICD-10-CM | POA: Diagnosis not present

## 2016-07-25 DIAGNOSIS — J310 Chronic rhinitis: Secondary | ICD-10-CM | POA: Diagnosis not present

## 2016-07-25 DIAGNOSIS — R0982 Postnasal drip: Secondary | ICD-10-CM | POA: Diagnosis not present

## 2016-07-25 DIAGNOSIS — J343 Hypertrophy of nasal turbinates: Secondary | ICD-10-CM | POA: Diagnosis not present

## 2016-08-04 DIAGNOSIS — M7711 Lateral epicondylitis, right elbow: Secondary | ICD-10-CM | POA: Diagnosis not present

## 2016-08-16 DIAGNOSIS — F331 Major depressive disorder, recurrent, moderate: Secondary | ICD-10-CM | POA: Diagnosis not present

## 2016-08-16 DIAGNOSIS — F902 Attention-deficit hyperactivity disorder, combined type: Secondary | ICD-10-CM | POA: Diagnosis not present

## 2016-08-16 DIAGNOSIS — F411 Generalized anxiety disorder: Secondary | ICD-10-CM | POA: Diagnosis not present

## 2016-08-17 DIAGNOSIS — M7711 Lateral epicondylitis, right elbow: Secondary | ICD-10-CM | POA: Diagnosis not present

## 2016-08-28 DIAGNOSIS — Z23 Encounter for immunization: Secondary | ICD-10-CM | POA: Diagnosis not present

## 2016-08-29 ENCOUNTER — Ambulatory Visit: Payer: Managed Care, Other (non HMO) | Admitting: Allergy and Immunology

## 2016-09-04 ENCOUNTER — Encounter: Payer: Self-pay | Admitting: Allergy and Immunology

## 2016-09-04 ENCOUNTER — Encounter (INDEPENDENT_AMBULATORY_CARE_PROVIDER_SITE_OTHER): Payer: Self-pay

## 2016-09-04 ENCOUNTER — Ambulatory Visit (INDEPENDENT_AMBULATORY_CARE_PROVIDER_SITE_OTHER): Payer: BLUE CROSS/BLUE SHIELD | Admitting: Allergy and Immunology

## 2016-09-04 VITALS — BP 132/78 | HR 82 | Temp 98.3°F | Resp 16 | Ht 63.0 in | Wt 153.2 lb

## 2016-09-04 DIAGNOSIS — H6983 Other specified disorders of Eustachian tube, bilateral: Secondary | ICD-10-CM

## 2016-09-04 DIAGNOSIS — J3089 Other allergic rhinitis: Secondary | ICD-10-CM | POA: Insufficient documentation

## 2016-09-04 DIAGNOSIS — H6990 Unspecified Eustachian tube disorder, unspecified ear: Secondary | ICD-10-CM | POA: Insufficient documentation

## 2016-09-04 DIAGNOSIS — H6980 Other specified disorders of Eustachian tube, unspecified ear: Secondary | ICD-10-CM | POA: Insufficient documentation

## 2016-09-04 DIAGNOSIS — Z91018 Allergy to other foods: Secondary | ICD-10-CM | POA: Diagnosis not present

## 2016-09-04 MED ORDER — AZELASTINE-FLUTICASONE 137-50 MCG/ACT NA SUSP: 1.0000 | Freq: Two times a day (BID) | NASAL | 5 refills | Status: DC

## 2016-09-04 MED ORDER — LEVOCETIRIZINE DIHYDROCHLORIDE 5 MG PO TABS: 5.0000 mg | ORAL_TABLET | Freq: Every evening | ORAL | 5 refills | Status: DC

## 2016-09-04 MED ORDER — LEVOCETIRIZINE DIHYDROCHLORIDE 5 MG PO TABS: 5.0000 mg | ORAL_TABLET | ORAL | 5 refills | Status: DC | PRN

## 2016-09-04 NOTE — Progress Notes (Signed)
New Patient Note  RE: Karen Ortiz MRN: [id number] DOB: 01-01-1976 Date of Office Visit: 09/04/2016  Referring provider: Christia ReadingBates, Dwight, MD Primary care provider: Maryelizabeth RowanEWEY,ELIZABETH, MD  Chief Complaint: Allergic Rhinitis  and Other (ear pressure)   History of present illness: Karen Ortiz is a 40 y.o. female seen today in consultation requested by Christia Readingwight Bates, MD.  She reports that over the past 2-3 years she has experienced postnasal drainage "constantly", ear pressure, nasal congestion, nasal pruritus, ocular pruritus, as well as occasional sneezing.  The symptoms occur year around but are most frequent/severe in the springtime and fall.  These symptoms have persisted despite compliance with multiple allergy medications and she is frustrated that she can "never go off medications."  Structural abnormalities were ruled out by her otolaryngologist, Dr. Christia Readingwight Bates.  She has no history of asthma.  She apparently had screening blood work in the past which revealed equivocal/borderline positive results to several foods, however she denies symptoms associated with the consumption of these foods.   Assessment and plan: Perennial and seasonal allergic rhinitis  Aeroallergen avoidance measures have been discussed and provided in written form.  A prescription has been provided for levocetirizine, 5mg  daily as needed.  A prescription has been provided for Dymista (azelastine/fluticasone) nasal spray, 1 spray per nostril twice daily as needed. Proper nasal spray technique has been discussed and demonstrated.  Nasal saline lavage (NeilMed) has been recommended prior to medicated nasal sprays and as needed along with instructions for proper administration.  For thick post nasal drainage, nasal congestion, and/or sinus pressure, add guaifenesin 1200 mg (Mucinex Maximum Strength) plus/minus pseudoephedrine 120 mg  twice daily as needed with adequate hydration as discussed. Pseudoephedrine is  only to be used for short-term relief of nasal/sinus congestion. Long-term use is discouraged due to potential side effects.  The risks and benefits of aeroallergen immunotherapy have been discussed. The patient is motivated to initiate immunotherapy if insurance coverage is favorable. She will let us know how she would like to proceed.  Eustachian tube dysfunction Treatment plan as outlined above.  History of food allergy Food allergen skin tests were negative today despite a positive histamine control.  The negative predictive value of food allergen skin testing is excellent (95%).  The positive in vitro results she had in the past did not correlate with symptoms associated with the consumption of these foods and therefore represent false positive results.   Meds ordered this encounter  Medications  . DISCONTD: levocetirizine (XYZAL) 5 MG tablet    Sig: Take 1 tablet (5 mg total) by mouth every evening.    Dispense:  30 tablet    Refill:  5  . DISCONTD: Azelastine-Fluticasone (DYMISTA) 137-50 MCG/ACT SUSP    Sig: Place 1 spray into both nostrils 2 (two) times daily.    Dispense:  1 Bottle    Refill:  5  . levocetirizine (XYZAL) 5 MG tablet    Sig: Take 1 tablet (5 mg total) by mouth as needed for allergies.    Dispense:  30 tablet    Refill:  5  . Azelastine-Fluticasone (DYMISTA) 137-50 MCG/ACT SUSP    Sig: Place 1 spray into both nostrils 2 (two) times daily.    Dispense:  1 Bottle    Refill:  5    Diagnostics: Allergy skin testing: Positive to grass pollen, weed pollen, ragweed pollen, tree pollen, mold, cat hair, dog epithelia, and cockroach antigen. Select food allergen skin testing: Negative despite a positive histamine control.  Physical examination: Blood pressure 132/78, pulse 82, temperature 98.3 F (36.8 C), temperature source Oral, resp. rate 16, height 5\' 3"  (1.6 m), weight 153 lb 3.2 oz (69.5 kg), SpO2 98 %, unknown if currently breastfeeding.  General: Alert,  interactive, in no acute distress. HEENT: TMs pearly gray, turbinates edematous with thick discharge, post-pharynx erythematous. Neck: Supple without lymphadenopathy. Lungs: Clear to auscultation without wheezing, rhonchi or rales. CV: Normal S1, S2 without murmurs. Abdomen: Nondistended, nontender. Skin: Warm and dry, without lesions or rashes. Extremities:  No clubbing, cyanosis or edema. Neuro:   Grossly intact.  Review of systems:  Review of systems negative except as noted in HPI / PMHx or noted below: Review of Systems  Constitutional: Negative.   HENT: Negative.   Eyes: Negative.   Respiratory: Negative.   Cardiovascular: Negative.   Gastrointestinal: Negative.   Genitourinary: Negative.   Musculoskeletal: Negative.   Skin: Negative.   Neurological: Negative.   Endo/Heme/Allergies: Negative.   Psychiatric/Behavioral: Negative.     Past medical history:  Past Medical History:  Diagnosis Date  . Gestational diabetes   . Hx of varicella     Past surgical history:  Past Surgical History:  Procedure Laterality Date  . WISDOM TOOTH EXTRACTION      Family history: Family History  Problem Relation Age of Onset  . Heart murmur Mother   . Heart disease Mother   . Migraines Mother   . Alcohol abuse Father   . Dementia Maternal Grandmother   . Heart disease Maternal Grandfather   . Heart attack Maternal Grandfather   . Cancer Paternal Grandmother     skin and leukemia    Social history: Social History   Social History  . Marital status: Married    Spouse name: N/A  . Number of children: N/A  . Years of education: N/A   Occupational History  . Not on file.   Social History Main Topics  . Smoking status: Never Smoker  . Smokeless tobacco: Never Used  . Alcohol use No  . Drug use: No  . Sexual activity: Yes    Partners: Male    Birth control/ protection: Pill   Other Topics Concern  . Not on file   Social History Narrative  . No narrative on file    Environmental History: The patient lives in a 40 year old house with carpeting throughout and central air/heat.  She is a nonsmoker without pets.    Medication List       Accurate as of 09/04/16  2:51 PM. Always use your most recent med list.          amphetamine-dextroamphetamine 30 MG 24 hr capsule Commonly known as:  ADDERALL XR Take 30 mg by mouth daily.   Azelastine-Fluticasone 137-50 MCG/ACT Susp Commonly known as:  DYMISTA Place 1 spray into both nostrils 2 (two) times daily.   buPROPion 150 MG 24 hr tablet Commonly known as:  WELLBUTRIN XL Take 150 mg by mouth daily.   clobetasol cream 0.05 % Commonly known as:  TEMOVATE   fluticasone 50 MCG/ACT nasal spray Commonly known as:  FLONASE   ibuprofen 600 MG tablet Commonly known as:  ADVIL,MOTRIN Take 1 tablet (600 mg total) by mouth every 6 (six) hours.   ketoconazole 2 % shampoo Commonly known as:  NIZORAL Apply 2 application topically as needed.   levocetirizine 5 MG tablet Commonly known as:  XYZAL Take 1 tablet (5 mg total) by mouth as needed for allergies.   mometasone 50 MCG/ACT  nasal spray Commonly known as:  NASONEX Place 50 sprays into the nose as needed.   montelukast 10 MG tablet Commonly known as:  SINGULAIR Take 10 mg by mouth at bedtime.   TRINESSA LO 0.18/0.215/0.25 MG-25 MCG tab Generic drug:  Norgestimate-Ethinyl Estradiol Triphasic Take 0.18 mg by mouth daily.       Known medication allergies: No Known Allergies  I appreciate the opportunity to take part in Nguyet's care. Please do not hesitate to contact me with questions.  Sincerely,   R. Jorene Guestarter Ashyr Hedgepath, MD

## 2016-09-04 NOTE — Assessment & Plan Note (Signed)
Food allergen skin tests were negative today despite a positive histamine control.  The negative predictive value of food allergen skin testing is excellent (95%).  The positive in vitro results she had in the past did not correlate with symptoms associated with the consumption of these foods and therefore represent false positive results.

## 2016-09-04 NOTE — Assessment & Plan Note (Signed)
   Aeroallergen avoidance measures have been discussed and provided in written form.  A prescription has been provided for levocetirizine, 5mg  daily as needed.  A prescription has been provided for Dymista (azelastine/fluticasone) nasal spray, 1 spray per nostril twice daily as needed. Proper nasal spray technique has been discussed and demonstrated.  Nasal saline lavage (NeilMed) has been recommended prior to medicated nasal sprays and as needed along with instructions for proper administration.  For thick post nasal drainage, nasal congestion, and/or sinus pressure, add guaifenesin 1200 mg (Mucinex Maximum Strength) plus/minus pseudoephedrine 120 mg  twice daily as needed with adequate hydration as discussed. Pseudoephedrine is only to be used for short-term relief of nasal/sinus congestion. Long-term use is discouraged due to potential side effects.  The risks and benefits of aeroallergen immunotherapy have been discussed. The patient is motivated to initiate immunotherapy if insurance coverage is favorable. She will let us know how she would like to proceed.

## 2016-09-04 NOTE — Patient Instructions (Addendum)
Perennial and seasonal allergic rhinitis  Aeroallergen avoidance measures have been discussed and provided in written form.  A prescription has been provided for levocetirizine, 5mg  daily as needed.  A prescription has been provided for Dymista (azelastine/fluticasone) nasal spray, 1 spray per nostril twice daily as needed. Proper nasal spray technique has been discussed and demonstrated.  Nasal saline lavage (NeilMed) has been recommended prior to medicated nasal sprays and as needed along with instructions for proper administration.  For thick post nasal drainage, nasal congestion, and/or sinus pressure, add guaifenesin 1200 mg (Mucinex Maximum Strength) plus/minus pseudoephedrine 120 mg  twice daily as needed with adequate hydration as discussed. Pseudoephedrine is only to be used for short-term relief of nasal/sinus congestion. Long-term use is discouraged due to potential side effects.  The risks and benefits of aeroallergen immunotherapy have been discussed. The patient is motivated to initiate immunotherapy if insurance coverage is favorable. She will let us know how she would like to proceed.  Eustachian tube dysfunction Treatment plan as outlined above.  History of food allergy Food allergen skin tests were negative today despite a positive histamine control.  The negative predictive value of food allergen skin testing is excellent (95%).  The positive in vitro results she had in the past did not correlate with symptoms associated with the consumption of these foods and therefore represent false positive results.   Return in about 3 months (around 12/03/2016), or if symptoms worsen or fail to improve.  Reducing Pollen Exposure  The American Academy of Allergy, Asthma and Immunology suggests the following steps to reduce your exposure to pollen during allergy seasons.    1. Do not hang sheets or clothing out to dry; pollen may collect on these items. 2. Do not mow lawns or spend  time around freshly cut grass; mowing stirs up pollen. 3. Keep windows closed at night.  Keep car windows closed while driving. 4. Minimize morning activities outdoors, a time when pollen counts are usually at their highest. 5. Stay indoors as much as possible when pollen counts or humidity is high and on windy days when pollen tends to remain in the air longer. 6. Use air conditioning when possible.  Many air conditioners have filters that trap the pollen spores. 7. Use a HEPA room air filter to remove pollen form the indoor air you breathe.   Control of Mold Allergen  Mold and fungi can grow on a variety of surfaces provided certain temperature and moisture conditions exist.  Outdoor molds grow on plants, decaying vegetation and soil.  The major outdoor mold, Alternaria and Cladosporium, are found in very high numbers during hot and dry conditions.  Generally, a late Summer - Fall peak is seen for common outdoor fungal spores.  Rain will temporarily lower outdoor mold spore count, but counts rise rapidly when the rainy period ends.  The most important indoor molds are Aspergillus and Penicillium.  Dark, humid and poorly ventilated basements are ideal sites for mold growth.  The next most common sites of mold growth are the bathroom and the kitchen.  Outdoor MicrosoftMold Control 6. Use air conditioning and keep windows closed 7. Avoid exposure to decaying vegetation. 8. Avoid leaf raking. 9. Avoid grain handling. 10. Consider wearing a face mask if working in moldy areas.  Indoor Mold Control 1. Maintain humidity below 50%. 2. Clean washable surfaces with 5% bleach solution. 3. Remove sources e.g. Contaminated carpets.  Control of Cockroach Allergen  Cockroach allergen has been identified as an important cause  of acute attacks of asthma, especially in urban settings.  There are fifty-five species of cockroach that exist in the [place], however only three, the [place], [place] and Oriental  species produce allergen that can affect patients with Asthma.  Allergens can be obtained from fecal particles, egg casings and secretions from cockroaches.    1. Remove food sources. 2. Reduce access to water. 3. Seal access and entry points. 4. Spray runways with 0.5-1% Diazinon or Chlorpyrifos 5. Blow boric acid power under stoves and refrigerator. 6. Place bait stations (hydramethylnon) at feeding sites.

## 2016-09-04 NOTE — Assessment & Plan Note (Signed)
   Treatment plan as outlined above. 

## 2016-09-12 DIAGNOSIS — F330 Major depressive disorder, recurrent, mild: Secondary | ICD-10-CM | POA: Diagnosis not present

## 2016-09-13 DIAGNOSIS — S93491A Sprain of other ligament of right ankle, initial encounter: Secondary | ICD-10-CM | POA: Diagnosis not present

## 2016-09-19 ENCOUNTER — Telehealth: Payer: Self-pay

## 2016-09-19 NOTE — Telephone Encounter (Signed)
Returned pt's cll  - LMOVMTC to schedule immunotherapy injections.

## 2016-10-04 NOTE — Progress Notes (Signed)
Vials to be made 10-05-16.  jm 

## 2016-10-04 NOTE — Addendum Note (Signed)
Addended by: Candis SchatzBOBBITT, Kaidan Harpster C on: 10/04/2016 01:25 PM   Modules accepted: Orders

## 2016-10-05 DIAGNOSIS — J3089 Other allergic rhinitis: Secondary | ICD-10-CM | POA: Diagnosis not present

## 2016-10-06 DIAGNOSIS — J301 Allergic rhinitis due to pollen: Secondary | ICD-10-CM | POA: Diagnosis not present

## 2016-10-09 DIAGNOSIS — F330 Major depressive disorder, recurrent, mild: Secondary | ICD-10-CM | POA: Diagnosis not present

## 2016-10-10 DIAGNOSIS — F411 Generalized anxiety disorder: Secondary | ICD-10-CM | POA: Diagnosis not present

## 2016-10-10 DIAGNOSIS — F331 Major depressive disorder, recurrent, moderate: Secondary | ICD-10-CM | POA: Diagnosis not present

## 2016-10-10 DIAGNOSIS — F902 Attention-deficit hyperactivity disorder, combined type: Secondary | ICD-10-CM | POA: Diagnosis not present

## 2016-10-12 ENCOUNTER — Encounter: Payer: Self-pay | Admitting: Vascular Surgery

## 2016-10-17 ENCOUNTER — Ambulatory Visit (INDEPENDENT_AMBULATORY_CARE_PROVIDER_SITE_OTHER): Payer: BLUE CROSS/BLUE SHIELD | Admitting: Vascular Surgery

## 2016-10-17 ENCOUNTER — Encounter: Payer: Self-pay | Admitting: Vascular Surgery

## 2016-10-17 VITALS — BP 132/94 | HR 97 | Temp 97.7°F | Resp 16 | Ht 63.0 in | Wt 145.0 lb

## 2016-10-17 DIAGNOSIS — I83893 Varicose veins of bilateral lower extremities with other complications: Secondary | ICD-10-CM | POA: Diagnosis not present

## 2016-10-17 NOTE — Progress Notes (Signed)
Subjective:     Patient ID: Karen JainLindsay Ortiz, female   DOB: Nov 24, 1975, 41 y.o.   MRN: [id number]  HPI This 41 year old female is evaluated for bilateral painful varicosities. She was previously evaluated at [facility] but no previous procedures have been performed. It was recommended and apparently approved through her insurance company to have laser ablation of the right great saphenous vein followed by endovenous chemical ablation procedures. Her left leg was not approved. She has been having pain in her varicosities in the right thigh since her first pregnancy 4 years ago. She did have some labial varicosities during pregnancy which resolved following delivery. She has no history of DVT, phlebitis stasis ulcers or bleeding. She does not develop significant swelling as the day progresses. She has tried long-leg elastic compression stockings 20-30 millimeter gradient without improvement.  Past Medical History:  Diagnosis Date  . Gestational diabetes   . Hx of varicella     Social History  Substance Use Topics  . Smoking status: Never Smoker  . Smokeless tobacco: Never Used  . Alcohol use No    Family History  Problem Relation Age of Onset  . Heart murmur Mother   . Heart disease Mother   . Migraines Mother   . Alcohol abuse Father   . Dementia Maternal Grandmother   . Heart disease Maternal Grandfather   . Heart attack Maternal Grandfather   . Cancer Paternal Grandmother     skin and leukemia    No Known Allergies   Current Outpatient Prescriptions:  .  amphetamine-dextroamphetamine (ADDERALL XR) 30 MG 24 hr capsule, Take 30 mg by mouth daily., Disp: , Rfl:  .  Azelastine-Fluticasone (DYMISTA) 137-50 MCG/ACT SUSP, Place 1 spray into both nostrils 2 (two) times daily., Disp: 1 Bottle, Rfl: 5 .  buPROPion (WELLBUTRIN XL) 150 MG 24 hr tablet, Take 300 mg by mouth daily. , Disp: , Rfl:  .  clobetasol cream (TEMOVATE) 0.05 %, , Disp: , Rfl:  .  fluticasone (FLONASE) 50 MCG/ACT  nasal spray, , Disp: , Rfl:  .  ketoconazole (NIZORAL) 2 % shampoo, Apply 2 application topically as needed., Disp: , Rfl:  .  levocetirizine (XYZAL) 5 MG tablet, Take 1 tablet (5 mg total) by mouth as needed for allergies., Disp: 30 tablet, Rfl: 5 .  montelukast (SINGULAIR) 10 MG tablet, Take 10 mg by mouth at bedtime., Disp: , Rfl:  .  Norgestimate-Ethinyl Estradiol Triphasic (TRINESSA LO) 0.18/0.215/0.25 MG-25 MCG tab, Take 0.18 mg by mouth daily., Disp: , Rfl:  .  ibuprofen (ADVIL,MOTRIN) 600 MG tablet, Take 1 tablet (600 mg total) by mouth every 6 (six) hours. (Patient not taking: Reported on 10/17/2016), Disp: 30 tablet, Rfl: 0 .  mometasone (NASONEX) 50 MCG/ACT nasal spray, Place 50 sprays into the nose as needed., Disp: , Rfl:   Vitals:   10/17/16 1126  BP: (!) 132/94  Pulse: 97  Resp: 16  Temp: 97.7 F (36.5 C)  Weight: 145 lb (65.8 kg)  Height: 5\' 3"  (1.6 m)    Body mass index is 25.69 kg/m.         Review of Systems  Eyes chest pain, dyspnea on exertion, PND, orthopnea, hemoptysis, claudication    Objective:   Physical Exam BP (!) 132/94 (BP Location: Left Arm, Patient Position: Sitting, Cuff Size: Normal)   Pulse 97   Temp 97.7 F (36.5 C)   Resp 16   Ht 5\' 3"  (1.6 m)   Wt 145 lb (65.8 kg)   BMI  25.69 kg/m     Gen.-alert and oriented x3 in no apparent distress HEENT normal for age Lungs no rhonchi or wheezing Cardiovascular regular rhythm no murmurs carotid pulses 3+ palpable no bruits audible Abdomen soft nontender no palpable masses Musculoskeletal free of  major deformities Skin clear -no rashes Neurologic normal Lower extremities 3+ femoral and dorsalis pedis pulses palpable bilaterally with no edema Right leg has small varicosities in the medial thigh over the great saphenous system down to the knee level with some spider veins as well. No hyperpigmentation ulceration or edema distally Left leg has spider veins in the medial thigh.  Lab  performed a bedside SonoSite ultrasound exam which revealed the great saphenous vein to have some reflux at the saphenofemoral junction extending down the great saphenous vein about 5 or 6 cm and then the vein divides into 2 small branches which are small throughout the length of the leg. Left leg has a similar finding.  I did review the ultrasound report from [facility] performed 06/06/2016 which revealed an enlarged great saphenous vein down to the proximal thigh but then small caliber vein distal to this point      Assessment:     Small varicose veins and spider veins with gross reflux over short segment of great saphenous vein near saphenofemoral junction but small caliber vein distal to this point    Plan:     Have recommended foam sclerotherapy No indication for laser ablation of the right great saphenous vein Patient will decide if she would like to proceed with foam sclerotherapy

## 2016-10-19 ENCOUNTER — Ambulatory Visit: Payer: BLUE CROSS/BLUE SHIELD

## 2016-10-23 DIAGNOSIS — F330 Major depressive disorder, recurrent, mild: Secondary | ICD-10-CM | POA: Diagnosis not present

## 2016-10-30 ENCOUNTER — Ambulatory Visit (INDEPENDENT_AMBULATORY_CARE_PROVIDER_SITE_OTHER): Payer: BLUE CROSS/BLUE SHIELD | Admitting: *Deleted

## 2016-10-30 DIAGNOSIS — J309 Allergic rhinitis, unspecified: Secondary | ICD-10-CM | POA: Diagnosis not present

## 2016-11-02 ENCOUNTER — Ambulatory Visit (INDEPENDENT_AMBULATORY_CARE_PROVIDER_SITE_OTHER): Payer: BLUE CROSS/BLUE SHIELD

## 2016-11-02 DIAGNOSIS — J309 Allergic rhinitis, unspecified: Secondary | ICD-10-CM | POA: Diagnosis not present

## 2016-11-02 NOTE — Progress Notes (Signed)
Immunotherapy   Patient Details  Name: Karen JainLindsay Ortiz MRN: [id number] Date of Birth: [birth year]  10/30/2016  Karen Ortiz started injections for  Detar [place]-Mite-Cat-Dog-CR & Karilyn Cota[place]ass-Weed-Tree Following schedule: A  Frequency:1-2 times per week Epi-Pen:Epi-Pen Available  Consent signed and patient instructions given. No problems after 30 minutes in the office.    Karen RedheadHeather Ortiz 11/02/2016, 8:30 AM

## 2016-11-06 ENCOUNTER — Ambulatory Visit (INDEPENDENT_AMBULATORY_CARE_PROVIDER_SITE_OTHER): Payer: BLUE CROSS/BLUE SHIELD

## 2016-11-06 DIAGNOSIS — J309 Allergic rhinitis, unspecified: Secondary | ICD-10-CM

## 2016-11-06 DIAGNOSIS — F330 Major depressive disorder, recurrent, mild: Secondary | ICD-10-CM | POA: Diagnosis not present

## 2016-11-08 DIAGNOSIS — F902 Attention-deficit hyperactivity disorder, combined type: Secondary | ICD-10-CM | POA: Diagnosis not present

## 2016-11-08 DIAGNOSIS — F331 Major depressive disorder, recurrent, moderate: Secondary | ICD-10-CM | POA: Diagnosis not present

## 2016-11-08 DIAGNOSIS — F411 Generalized anxiety disorder: Secondary | ICD-10-CM | POA: Diagnosis not present

## 2016-11-09 ENCOUNTER — Ambulatory Visit (INDEPENDENT_AMBULATORY_CARE_PROVIDER_SITE_OTHER): Payer: BLUE CROSS/BLUE SHIELD

## 2016-11-09 DIAGNOSIS — J309 Allergic rhinitis, unspecified: Secondary | ICD-10-CM

## 2016-11-13 ENCOUNTER — Ambulatory Visit (INDEPENDENT_AMBULATORY_CARE_PROVIDER_SITE_OTHER): Payer: BLUE CROSS/BLUE SHIELD | Admitting: *Deleted

## 2016-11-13 DIAGNOSIS — J309 Allergic rhinitis, unspecified: Secondary | ICD-10-CM

## 2016-11-16 ENCOUNTER — Ambulatory Visit (INDEPENDENT_AMBULATORY_CARE_PROVIDER_SITE_OTHER): Payer: BLUE CROSS/BLUE SHIELD | Admitting: *Deleted

## 2016-11-16 DIAGNOSIS — J309 Allergic rhinitis, unspecified: Secondary | ICD-10-CM

## 2016-11-20 ENCOUNTER — Ambulatory Visit (INDEPENDENT_AMBULATORY_CARE_PROVIDER_SITE_OTHER): Payer: BLUE CROSS/BLUE SHIELD

## 2016-11-20 DIAGNOSIS — J309 Allergic rhinitis, unspecified: Secondary | ICD-10-CM | POA: Diagnosis not present

## 2016-11-23 ENCOUNTER — Ambulatory Visit (INDEPENDENT_AMBULATORY_CARE_PROVIDER_SITE_OTHER): Payer: BLUE CROSS/BLUE SHIELD

## 2016-11-23 DIAGNOSIS — J309 Allergic rhinitis, unspecified: Secondary | ICD-10-CM | POA: Diagnosis not present

## 2016-11-27 ENCOUNTER — Ambulatory Visit (INDEPENDENT_AMBULATORY_CARE_PROVIDER_SITE_OTHER): Payer: BLUE CROSS/BLUE SHIELD | Admitting: *Deleted

## 2016-11-27 DIAGNOSIS — J309 Allergic rhinitis, unspecified: Secondary | ICD-10-CM | POA: Diagnosis not present

## 2016-11-27 DIAGNOSIS — F330 Major depressive disorder, recurrent, mild: Secondary | ICD-10-CM | POA: Diagnosis not present

## 2016-11-30 ENCOUNTER — Ambulatory Visit (INDEPENDENT_AMBULATORY_CARE_PROVIDER_SITE_OTHER): Payer: BLUE CROSS/BLUE SHIELD | Admitting: *Deleted

## 2016-11-30 DIAGNOSIS — J309 Allergic rhinitis, unspecified: Secondary | ICD-10-CM | POA: Diagnosis not present

## 2016-12-04 ENCOUNTER — Ambulatory Visit (INDEPENDENT_AMBULATORY_CARE_PROVIDER_SITE_OTHER): Payer: BLUE CROSS/BLUE SHIELD

## 2016-12-04 DIAGNOSIS — J309 Allergic rhinitis, unspecified: Secondary | ICD-10-CM | POA: Diagnosis not present

## 2016-12-05 ENCOUNTER — Ambulatory Visit (INDEPENDENT_AMBULATORY_CARE_PROVIDER_SITE_OTHER): Payer: BLUE CROSS/BLUE SHIELD | Admitting: Allergy and Immunology

## 2016-12-05 ENCOUNTER — Encounter: Payer: Self-pay | Admitting: Allergy and Immunology

## 2016-12-05 DIAGNOSIS — H6983 Other specified disorders of Eustachian tube, bilateral: Secondary | ICD-10-CM

## 2016-12-05 DIAGNOSIS — J3089 Other allergic rhinitis: Secondary | ICD-10-CM | POA: Diagnosis not present

## 2016-12-05 NOTE — Patient Instructions (Addendum)
Perennial and seasonal allergic rhinitis  Continue appropriate allergen avoidance measures and aeroallergen immunotherapy as prescribed and as tolerated.  For symptom relief, Dymista nasal spray, one spray per nostril twice daily as needed, and nasal saline irrigation as needed.  For thick post nasal drainage, nasal congestion, and/or sinus pressure, add guaifenesin 1200 mg (Mucinex Maximum Strength) plus/minus pseudoephedrine 120 mg  twice daily as needed with adequate hydration as discussed. Pseudoephedrine is only to be used for short-term relief of nasal/sinus congestion. Long-term use is discouraged due to potential side effects.  Eustachian tube dysfunction  Treatment plan as outlined above.   Return in about 6 months (around 06/07/2017), or if symptoms worsen or fail to improve.

## 2016-12-05 NOTE — Assessment & Plan Note (Signed)
   Treatment plan as outlined above. 

## 2016-12-05 NOTE — Assessment & Plan Note (Signed)
   Continue appropriate allergen avoidance measures and aeroallergen immunotherapy as prescribed and as tolerated.  For symptom relief, Dymista nasal spray, one spray per nostril twice daily as needed, and nasal saline irrigation as needed.  For thick post nasal drainage, nasal congestion, and/or sinus pressure, add guaifenesin 1200 mg (Mucinex Maximum Strength) plus/minus pseudoephedrine 120 mg  twice daily as needed with adequate hydration as discussed. Pseudoephedrine is only to be used for short-term relief of nasal/sinus congestion. Long-term use is discouraged due to potential side effects.

## 2016-12-05 NOTE — Progress Notes (Signed)
Follow-up Note  RE: Karen Ortiz MRN: [id number] DOB: December 14, 1975 Date of Office Visit: 12/05/2016  Primary care provider: Maryelizabeth RowanEWEY,ELIZABETH, MD Referring provider: Lewis Moccasinewey, Elizabeth R, MD  History of present illness: Karen Ortiz is a 41 y.o. female perennial and seasonal allergic rhinitis, history of eustachian tube dysfunction and history of food allergy presents today for follow up.  She was previously seen in this clinic for her initial evaluation on 09/04/2016.  She reports that she started aeroallergen immunotherapy one month ago.  She has been receiving the immunotherapy buildup injections without problems or complications.  She has still been experiencing persistent postnasal drainage and occasional ear pressure, however admits that she has not been taking any of the medications recommended during her previous visit.   Assessment and plan: Perennial and seasonal allergic rhinitis  Continue appropriate allergen avoidance measures and aeroallergen immunotherapy as prescribed and as tolerated.  For symptom relief, Dymista nasal spray, one spray per nostril twice daily as needed, and nasal saline irrigation as needed.  For thick post nasal drainage, nasal congestion, and/or sinus pressure, add guaifenesin 1200 mg (Mucinex Maximum Strength) plus/minus pseudoephedrine 120 mg  twice daily as needed with adequate hydration as discussed. Pseudoephedrine is only to be used for short-term relief of nasal/sinus congestion. Long-term use is discouraged due to potential side effects.  Eustachian tube dysfunction  Treatment plan as outlined above.     Physical examination: Blood pressure 132/80, pulse 76, resp. rate 16, unknown if currently breastfeeding.  General: Alert, interactive, in no acute distress. HEENT: TMs pearly gray, turbinates moderately edematous without discharge, post-pharynx mildly erythematous. Neck: Supple without lymphadenopathy. Lungs: Clear to auscultation  without wheezing, rhonchi or rales. CV: Normal S1, S2 without murmurs. Skin: Warm and dry, without lesions or rashes.  The following portions of the patient's history were reviewed and updated as appropriate: allergies, current medications, past family history, past medical history, past social history, past surgical history and problem list.  Allergies as of 12/05/2016   No Known Allergies     Medication List       Accurate as of 12/05/16 12:34 PM. Always use your most recent med list.          amphetamine-dextroamphetamine 20 MG tablet Commonly known as:  ADDERALL   Azelastine-Fluticasone 137-50 MCG/ACT Susp Commonly known as:  DYMISTA Place 1 spray into both nostrils 2 (two) times daily.   buPROPion 150 MG 24 hr tablet Commonly known as:  WELLBUTRIN XL Take 300 mg by mouth daily.   clobetasol cream 0.05 % Commonly known as:  TEMOVATE   fluticasone 50 MCG/ACT nasal spray Commonly known as:  FLONASE   ibuprofen 600 MG tablet Commonly known as:  ADVIL,MOTRIN Take 1 tablet (600 mg total) by mouth every 6 (six) hours.   IRON 27 PO Take by mouth.   Iron 325 (65 Fe) MG Tabs Take by mouth.   ketoconazole 2 % shampoo Commonly known as:  NIZORAL Apply 2 application topically as needed.   levocetirizine 5 MG tablet Commonly known as:  XYZAL Take 1 tablet (5 mg total) by mouth as needed for allergies.   mometasone 50 MCG/ACT nasal spray Commonly known as:  NASONEX Place 50 sprays into the nose as needed.   montelukast 10 MG tablet Commonly known as:  SINGULAIR Take 10 mg by mouth at bedtime.   OMEGA-3-6-9 PO Take by mouth.   TRINESSA LO 0.18/0.215/0.25 MG-25 MCG tab Generic drug:  Norgestimate-Ethinyl Estradiol Triphasic Take 0.18 mg by mouth daily.  VITAMIN D PO Take 5,000 Units by mouth.       No Known Allergies  I appreciate the opportunity to take part in Karen Ortiz's care. Please do not hesitate to contact me with questions.  Sincerely,   Ortiz.  Jorene Guest, MD

## 2016-12-07 DIAGNOSIS — F330 Major depressive disorder, recurrent, mild: Secondary | ICD-10-CM | POA: Diagnosis not present

## 2016-12-18 ENCOUNTER — Ambulatory Visit (INDEPENDENT_AMBULATORY_CARE_PROVIDER_SITE_OTHER): Payer: BLUE CROSS/BLUE SHIELD

## 2016-12-18 DIAGNOSIS — J309 Allergic rhinitis, unspecified: Secondary | ICD-10-CM

## 2016-12-19 DIAGNOSIS — F902 Attention-deficit hyperactivity disorder, combined type: Secondary | ICD-10-CM | POA: Diagnosis not present

## 2016-12-19 DIAGNOSIS — F331 Major depressive disorder, recurrent, moderate: Secondary | ICD-10-CM | POA: Diagnosis not present

## 2016-12-19 DIAGNOSIS — F411 Generalized anxiety disorder: Secondary | ICD-10-CM | POA: Diagnosis not present

## 2016-12-21 ENCOUNTER — Ambulatory Visit (INDEPENDENT_AMBULATORY_CARE_PROVIDER_SITE_OTHER): Payer: BLUE CROSS/BLUE SHIELD | Admitting: *Deleted

## 2016-12-21 DIAGNOSIS — J309 Allergic rhinitis, unspecified: Secondary | ICD-10-CM | POA: Diagnosis not present

## 2016-12-21 DIAGNOSIS — F330 Major depressive disorder, recurrent, mild: Secondary | ICD-10-CM | POA: Diagnosis not present

## 2016-12-25 ENCOUNTER — Ambulatory Visit (INDEPENDENT_AMBULATORY_CARE_PROVIDER_SITE_OTHER): Payer: BLUE CROSS/BLUE SHIELD

## 2016-12-25 DIAGNOSIS — J309 Allergic rhinitis, unspecified: Secondary | ICD-10-CM | POA: Diagnosis not present

## 2016-12-28 ENCOUNTER — Ambulatory Visit (INDEPENDENT_AMBULATORY_CARE_PROVIDER_SITE_OTHER): Payer: BLUE CROSS/BLUE SHIELD | Admitting: *Deleted

## 2016-12-28 DIAGNOSIS — J309 Allergic rhinitis, unspecified: Secondary | ICD-10-CM

## 2017-01-02 ENCOUNTER — Encounter: Payer: Self-pay | Admitting: *Deleted

## 2017-01-08 ENCOUNTER — Ambulatory Visit (INDEPENDENT_AMBULATORY_CARE_PROVIDER_SITE_OTHER): Payer: BLUE CROSS/BLUE SHIELD | Admitting: *Deleted

## 2017-01-08 DIAGNOSIS — J309 Allergic rhinitis, unspecified: Secondary | ICD-10-CM | POA: Diagnosis not present

## 2017-01-10 ENCOUNTER — Ambulatory Visit (INDEPENDENT_AMBULATORY_CARE_PROVIDER_SITE_OTHER): Payer: Self-pay | Admitting: *Deleted

## 2017-01-10 DIAGNOSIS — I83893 Varicose veins of bilateral lower extremities with other complications: Secondary | ICD-10-CM

## 2017-01-10 NOTE — Progress Notes (Signed)
X=.4% Sotradecol administered with a 27g butterfly.  Patient received a total of 6cc.  This pt has reflux in the R GSV and maybe on the left. Long discussion. Decided to try one syringe. She may return for reflux study here (She had one last year at [facility] if this tx doesn't work. Easy access. She was nervous but tol well. Will follow prn.   Compression stockings applied: Yes.

## 2017-01-11 ENCOUNTER — Ambulatory Visit (INDEPENDENT_AMBULATORY_CARE_PROVIDER_SITE_OTHER): Payer: BLUE CROSS/BLUE SHIELD | Admitting: *Deleted

## 2017-01-11 ENCOUNTER — Encounter: Payer: Self-pay | Admitting: *Deleted

## 2017-01-11 DIAGNOSIS — J309 Allergic rhinitis, unspecified: Secondary | ICD-10-CM

## 2017-01-15 ENCOUNTER — Ambulatory Visit (INDEPENDENT_AMBULATORY_CARE_PROVIDER_SITE_OTHER): Payer: BLUE CROSS/BLUE SHIELD | Admitting: *Deleted

## 2017-01-15 DIAGNOSIS — J309 Allergic rhinitis, unspecified: Secondary | ICD-10-CM | POA: Diagnosis not present

## 2017-01-17 ENCOUNTER — Ambulatory Visit: Payer: BLUE CROSS/BLUE SHIELD | Admitting: *Deleted

## 2017-01-19 ENCOUNTER — Ambulatory Visit (INDEPENDENT_AMBULATORY_CARE_PROVIDER_SITE_OTHER): Payer: BLUE CROSS/BLUE SHIELD

## 2017-01-19 DIAGNOSIS — J309 Allergic rhinitis, unspecified: Secondary | ICD-10-CM | POA: Diagnosis not present

## 2017-01-26 ENCOUNTER — Ambulatory Visit (INDEPENDENT_AMBULATORY_CARE_PROVIDER_SITE_OTHER): Payer: BLUE CROSS/BLUE SHIELD

## 2017-01-26 DIAGNOSIS — J309 Allergic rhinitis, unspecified: Secondary | ICD-10-CM

## 2017-01-29 ENCOUNTER — Ambulatory Visit (INDEPENDENT_AMBULATORY_CARE_PROVIDER_SITE_OTHER): Payer: BLUE CROSS/BLUE SHIELD | Admitting: *Deleted

## 2017-01-29 DIAGNOSIS — J309 Allergic rhinitis, unspecified: Secondary | ICD-10-CM | POA: Diagnosis not present

## 2017-01-29 DIAGNOSIS — F330 Major depressive disorder, recurrent, mild: Secondary | ICD-10-CM | POA: Diagnosis not present

## 2017-02-01 ENCOUNTER — Ambulatory Visit (INDEPENDENT_AMBULATORY_CARE_PROVIDER_SITE_OTHER): Payer: BLUE CROSS/BLUE SHIELD | Admitting: *Deleted

## 2017-02-01 DIAGNOSIS — J309 Allergic rhinitis, unspecified: Secondary | ICD-10-CM

## 2017-02-05 ENCOUNTER — Ambulatory Visit (INDEPENDENT_AMBULATORY_CARE_PROVIDER_SITE_OTHER): Payer: BLUE CROSS/BLUE SHIELD

## 2017-02-05 DIAGNOSIS — J309 Allergic rhinitis, unspecified: Secondary | ICD-10-CM

## 2017-02-09 ENCOUNTER — Ambulatory Visit (INDEPENDENT_AMBULATORY_CARE_PROVIDER_SITE_OTHER): Payer: BLUE CROSS/BLUE SHIELD | Admitting: *Deleted

## 2017-02-09 DIAGNOSIS — J309 Allergic rhinitis, unspecified: Secondary | ICD-10-CM | POA: Diagnosis not present

## 2017-02-12 ENCOUNTER — Ambulatory Visit (INDEPENDENT_AMBULATORY_CARE_PROVIDER_SITE_OTHER): Payer: BLUE CROSS/BLUE SHIELD

## 2017-02-12 DIAGNOSIS — J309 Allergic rhinitis, unspecified: Secondary | ICD-10-CM

## 2017-02-13 DIAGNOSIS — F411 Generalized anxiety disorder: Secondary | ICD-10-CM | POA: Diagnosis not present

## 2017-02-13 DIAGNOSIS — F331 Major depressive disorder, recurrent, moderate: Secondary | ICD-10-CM | POA: Diagnosis not present

## 2017-02-13 DIAGNOSIS — F902 Attention-deficit hyperactivity disorder, combined type: Secondary | ICD-10-CM | POA: Diagnosis not present

## 2017-02-19 ENCOUNTER — Ambulatory Visit (INDEPENDENT_AMBULATORY_CARE_PROVIDER_SITE_OTHER): Payer: BLUE CROSS/BLUE SHIELD | Admitting: *Deleted

## 2017-02-19 DIAGNOSIS — J309 Allergic rhinitis, unspecified: Secondary | ICD-10-CM | POA: Diagnosis not present

## 2017-02-22 ENCOUNTER — Ambulatory Visit (INDEPENDENT_AMBULATORY_CARE_PROVIDER_SITE_OTHER): Payer: BLUE CROSS/BLUE SHIELD | Admitting: *Deleted

## 2017-02-22 DIAGNOSIS — J309 Allergic rhinitis, unspecified: Secondary | ICD-10-CM

## 2017-02-25 DIAGNOSIS — B373 Candidiasis of vulva and vagina: Secondary | ICD-10-CM | POA: Diagnosis not present

## 2017-02-27 ENCOUNTER — Ambulatory Visit (INDEPENDENT_AMBULATORY_CARE_PROVIDER_SITE_OTHER): Payer: BLUE CROSS/BLUE SHIELD | Admitting: *Deleted

## 2017-02-27 DIAGNOSIS — J309 Allergic rhinitis, unspecified: Secondary | ICD-10-CM | POA: Diagnosis not present

## 2017-02-28 DIAGNOSIS — F902 Attention-deficit hyperactivity disorder, combined type: Secondary | ICD-10-CM | POA: Diagnosis not present

## 2017-02-28 DIAGNOSIS — F411 Generalized anxiety disorder: Secondary | ICD-10-CM | POA: Diagnosis not present

## 2017-02-28 DIAGNOSIS — F331 Major depressive disorder, recurrent, moderate: Secondary | ICD-10-CM | POA: Diagnosis not present

## 2017-03-01 DIAGNOSIS — F330 Major depressive disorder, recurrent, mild: Secondary | ICD-10-CM | POA: Diagnosis not present

## 2017-03-06 DIAGNOSIS — Z0000 Encounter for general adult medical examination without abnormal findings: Secondary | ICD-10-CM | POA: Diagnosis not present

## 2017-03-06 DIAGNOSIS — Z13220 Encounter for screening for lipoid disorders: Secondary | ICD-10-CM | POA: Diagnosis not present

## 2017-03-08 ENCOUNTER — Ambulatory Visit (INDEPENDENT_AMBULATORY_CARE_PROVIDER_SITE_OTHER): Payer: BLUE CROSS/BLUE SHIELD | Admitting: *Deleted

## 2017-03-08 DIAGNOSIS — J3089 Other allergic rhinitis: Secondary | ICD-10-CM | POA: Diagnosis not present

## 2017-03-13 ENCOUNTER — Other Ambulatory Visit: Payer: Self-pay | Admitting: Family Medicine

## 2017-03-13 ENCOUNTER — Ambulatory Visit (INDEPENDENT_AMBULATORY_CARE_PROVIDER_SITE_OTHER): Payer: BLUE CROSS/BLUE SHIELD | Admitting: *Deleted

## 2017-03-13 DIAGNOSIS — Z01419 Encounter for gynecological examination (general) (routine) without abnormal findings: Secondary | ICD-10-CM | POA: Diagnosis not present

## 2017-03-13 DIAGNOSIS — J309 Allergic rhinitis, unspecified: Secondary | ICD-10-CM

## 2017-03-13 DIAGNOSIS — Z1231 Encounter for screening mammogram for malignant neoplasm of breast: Secondary | ICD-10-CM

## 2017-03-13 DIAGNOSIS — Z118 Encounter for screening for other infectious and parasitic diseases: Secondary | ICD-10-CM | POA: Diagnosis not present

## 2017-03-13 DIAGNOSIS — Z0000 Encounter for general adult medical examination without abnormal findings: Secondary | ICD-10-CM | POA: Diagnosis not present

## 2017-03-13 DIAGNOSIS — N760 Acute vaginitis: Secondary | ICD-10-CM | POA: Diagnosis not present

## 2017-03-13 DIAGNOSIS — Z6825 Body mass index (BMI) 25.0-25.9, adult: Secondary | ICD-10-CM | POA: Diagnosis not present

## 2017-03-20 ENCOUNTER — Ambulatory Visit (INDEPENDENT_AMBULATORY_CARE_PROVIDER_SITE_OTHER): Payer: BLUE CROSS/BLUE SHIELD | Admitting: *Deleted

## 2017-03-20 DIAGNOSIS — J309 Allergic rhinitis, unspecified: Secondary | ICD-10-CM

## 2017-03-28 DIAGNOSIS — F330 Major depressive disorder, recurrent, mild: Secondary | ICD-10-CM | POA: Diagnosis not present

## 2017-03-29 DIAGNOSIS — D229 Melanocytic nevi, unspecified: Secondary | ICD-10-CM | POA: Diagnosis not present

## 2017-03-29 DIAGNOSIS — L409 Psoriasis, unspecified: Secondary | ICD-10-CM | POA: Diagnosis not present

## 2017-04-03 ENCOUNTER — Ambulatory Visit (INDEPENDENT_AMBULATORY_CARE_PROVIDER_SITE_OTHER): Payer: BLUE CROSS/BLUE SHIELD

## 2017-04-03 DIAGNOSIS — J309 Allergic rhinitis, unspecified: Secondary | ICD-10-CM | POA: Diagnosis not present

## 2017-04-05 ENCOUNTER — Telehealth: Payer: Self-pay | Admitting: Allergy and Immunology

## 2017-04-05 DIAGNOSIS — J3089 Other allergic rhinitis: Secondary | ICD-10-CM

## 2017-04-05 MED ORDER — LEVOCETIRIZINE DIHYDROCHLORIDE 5 MG PO TABS: 5.0000 mg | ORAL_TABLET | ORAL | 1 refills | Status: DC | PRN

## 2017-04-05 NOTE — Telephone Encounter (Signed)
Pt called and needs to have Levocetirizine called into [facility] for 90Day Supply. [phone].

## 2017-04-05 NOTE — Telephone Encounter (Signed)
90 day supply sent to pharmacy

## 2017-04-10 ENCOUNTER — Ambulatory Visit (INDEPENDENT_AMBULATORY_CARE_PROVIDER_SITE_OTHER): Payer: BLUE CROSS/BLUE SHIELD | Admitting: *Deleted

## 2017-04-10 DIAGNOSIS — J309 Allergic rhinitis, unspecified: Secondary | ICD-10-CM

## 2017-04-17 ENCOUNTER — Ambulatory Visit (INDEPENDENT_AMBULATORY_CARE_PROVIDER_SITE_OTHER): Payer: BLUE CROSS/BLUE SHIELD | Admitting: *Deleted

## 2017-04-17 ENCOUNTER — Encounter (INDEPENDENT_AMBULATORY_CARE_PROVIDER_SITE_OTHER): Payer: Self-pay

## 2017-04-17 ENCOUNTER — Ambulatory Visit
Admission: RE | Admit: 2017-04-17 | Discharge: 2017-04-17 | Disposition: A | Discharge status: Home / Self Care | Payer: BLUE CROSS/BLUE SHIELD | Source: Ambulatory Visit | Attending: Family Medicine | Admitting: Family Medicine

## 2017-04-17 DIAGNOSIS — Z1231 Encounter for screening mammogram for malignant neoplasm of breast: Secondary | ICD-10-CM | POA: Diagnosis not present

## 2017-04-17 DIAGNOSIS — J309 Allergic rhinitis, unspecified: Secondary | ICD-10-CM | POA: Diagnosis not present

## 2017-04-25 DIAGNOSIS — F902 Attention-deficit hyperactivity disorder, combined type: Secondary | ICD-10-CM | POA: Diagnosis not present

## 2017-04-25 DIAGNOSIS — F331 Major depressive disorder, recurrent, moderate: Secondary | ICD-10-CM | POA: Diagnosis not present

## 2017-04-25 DIAGNOSIS — F411 Generalized anxiety disorder: Secondary | ICD-10-CM | POA: Diagnosis not present

## 2017-04-26 ENCOUNTER — Ambulatory Visit (INDEPENDENT_AMBULATORY_CARE_PROVIDER_SITE_OTHER): Payer: BLUE CROSS/BLUE SHIELD | Admitting: *Deleted

## 2017-04-26 DIAGNOSIS — J309 Allergic rhinitis, unspecified: Secondary | ICD-10-CM

## 2017-05-01 ENCOUNTER — Ambulatory Visit (INDEPENDENT_AMBULATORY_CARE_PROVIDER_SITE_OTHER): Payer: BLUE CROSS/BLUE SHIELD

## 2017-05-01 DIAGNOSIS — J309 Allergic rhinitis, unspecified: Secondary | ICD-10-CM | POA: Diagnosis not present

## 2017-05-08 ENCOUNTER — Ambulatory Visit (INDEPENDENT_AMBULATORY_CARE_PROVIDER_SITE_OTHER): Payer: BLUE CROSS/BLUE SHIELD

## 2017-05-08 DIAGNOSIS — J309 Allergic rhinitis, unspecified: Secondary | ICD-10-CM | POA: Diagnosis not present

## 2017-05-21 DIAGNOSIS — F330 Major depressive disorder, recurrent, mild: Secondary | ICD-10-CM | POA: Diagnosis not present

## 2017-05-22 ENCOUNTER — Ambulatory Visit (INDEPENDENT_AMBULATORY_CARE_PROVIDER_SITE_OTHER): Payer: BLUE CROSS/BLUE SHIELD | Admitting: *Deleted

## 2017-05-22 DIAGNOSIS — J309 Allergic rhinitis, unspecified: Secondary | ICD-10-CM

## 2017-05-31 ENCOUNTER — Ambulatory Visit (INDEPENDENT_AMBULATORY_CARE_PROVIDER_SITE_OTHER): Payer: BLUE CROSS/BLUE SHIELD | Admitting: *Deleted

## 2017-05-31 DIAGNOSIS — J309 Allergic rhinitis, unspecified: Secondary | ICD-10-CM | POA: Diagnosis not present

## 2017-06-06 ENCOUNTER — Ambulatory Visit (INDEPENDENT_AMBULATORY_CARE_PROVIDER_SITE_OTHER): Payer: BLUE CROSS/BLUE SHIELD | Admitting: *Deleted

## 2017-06-06 DIAGNOSIS — J309 Allergic rhinitis, unspecified: Secondary | ICD-10-CM | POA: Diagnosis not present

## 2017-06-07 NOTE — Progress Notes (Signed)
2 Mt. Vials made Exp 06/08/18 

## 2017-06-08 DIAGNOSIS — J301 Allergic rhinitis due to pollen: Secondary | ICD-10-CM | POA: Diagnosis not present

## 2017-06-12 ENCOUNTER — Encounter: Payer: Self-pay | Admitting: Allergy and Immunology

## 2017-06-12 ENCOUNTER — Ambulatory Visit: Payer: Self-pay | Admitting: *Deleted

## 2017-06-12 ENCOUNTER — Ambulatory Visit (INDEPENDENT_AMBULATORY_CARE_PROVIDER_SITE_OTHER): Payer: BLUE CROSS/BLUE SHIELD | Admitting: Allergy and Immunology

## 2017-06-12 VITALS — BP 130/74 | HR 67 | Resp 17 | Ht 63.0 in | Wt 145.0 lb

## 2017-06-12 DIAGNOSIS — J309 Allergic rhinitis, unspecified: Secondary | ICD-10-CM

## 2017-06-12 DIAGNOSIS — J3089 Other allergic rhinitis: Secondary | ICD-10-CM | POA: Diagnosis not present

## 2017-06-12 DIAGNOSIS — Z91018 Allergy to other foods: Secondary | ICD-10-CM

## 2017-06-12 NOTE — Assessment & Plan Note (Signed)
   Continue appropriate allergen avoidance measures, azelastine nasal spray as needed, and nasal saline irrigation if needed.   Continue aeroallergen aeroallergen immunotherapy injections.  The dose will be adjusted appropriately given the recent large local reaction.  For large local reactions, apply ice, topical hydrocortisone 1% cream, and take levocetirizine (Xyzal) or fexofenadine (Allegra).  I have recommended taking Xyzal or Allegra on the morning prior to coming in for her allergy injections.

## 2017-06-12 NOTE — Progress Notes (Signed)
Follow-up Note  RE: Karen JainLindsay Shevchenko MRN: [id number] DOB: 09/02/1976 Date of Office Visit: 06/12/2017  Primary care provider: Lewis Moccasinewey, Elizabeth R, MD Referring provider: Lewis Moccasinewey, Elizabeth R, MD  History of present illness: Karen Ortiz is a 41 y.o. female with allergic rhinitis, history of eustachian tube dysfunction, and history of food allergy presenting today for follow up.  She was last seen in this clinic in March 2018.  She has been receiving aeroallergen immunotherapy buildup injections and recently proceeded into her maintenance dose.  She notes that last week she received an injection in her left arm which has left a large local reaction.  She did not/has not experienced associated cardiopulmonary or GI symptoms.  She has not put ice or topical steroids on the large local reaction and had not taken an antihistamine until last night.  He reports that her nasal allergy symptoms have improved while on aeroallergen immunotherapy.   Assessment and plan: Perennial and seasonal allergic rhinitis  Continue appropriate allergen avoidance measures, azelastine nasal spray as needed, and nasal saline irrigation if needed.   Continue aeroallergen aeroallergen immunotherapy injections.  The dose will be adjusted appropriately given the recent large local reaction.  For large local reactions, apply ice, topical hydrocortisone 1% cream, and take levocetirizine (Xyzal) or fexofenadine (Allegra).  I have recommended taking Xyzal or Allegra on the morning prior to coming in for her allergy injections.   Physical examination: Blood pressure 130/74, pulse 67, resp. rate 17, height 5\' 3"  (1.6 m), weight 145 lb (65.8 kg), unknown if currently breastfeeding.  General: Alert, interactive, in no acute distress. HEENT: TMs pearly gray, turbinates minimally edematous without discharge, post-pharynx unremarkable. Neck: Supple without lymphadenopathy. Lungs: Clear to auscultation without wheezing,  rhonchi or rales. CV: Normal S1, S2 without murmurs. Skin: Warm and dry, without lesions or rashes.  The following portions of the patient's history were reviewed and updated as appropriate: allergies, current medications, past family history, past medical history, past social history, past surgical history and problem list.  Allergies as of 06/12/2017   No Known Allergies     Medication List       Accurate as of 06/12/17  1:37 PM. Always use your most recent med list.          Azelastine-Fluticasone 137-50 MCG/ACT Susp Commonly known as:  DYMISTA Place 1 spray into both nostrils 2 (two) times daily.   buPROPion 300 MG 24 hr tablet Commonly known as:  WELLBUTRIN XL   clobetasol cream 0.05 % Commonly known as:  TEMOVATE   CONCERTA 36 MG CR tablet Generic drug:  methylphenidate   ibuprofen 600 MG tablet Commonly known as:  ADVIL,MOTRIN Take 1 tablet (600 mg total) by mouth every 6 (six) hours.   IRON 27 PO Take by mouth.   Iron 325 (65 Fe) MG Tabs Take by mouth.   ketoconazole 2 % shampoo Commonly known as:  NIZORAL Apply 2 application topically as needed.   levocetirizine 5 MG tablet Commonly known as:  XYZAL Take 1 tablet (5 mg total) by mouth as needed for allergies.   OMEGA-3-6-9 PO Take by mouth.   TRINESSA LO 0.18/0.215/0.25 MG-25 MCG tab Generic drug:  Norgestimate-Ethinyl Estradiol Triphasic Take 0.18 mg by mouth daily.   VITAMIN D PO Take 5,000 Units by mouth.       No Known Allergies  I appreciate the opportunity to take part in Tresha's care. Please do not hesitate to contact me with questions.  Sincerely,   R. Montez Moritaarter  Verlin Fester, MD

## 2017-06-12 NOTE — Patient Instructions (Signed)
Perennial and seasonal allergic rhinitis  Continue appropriate allergen avoidance measures, azelastine nasal spray as needed, and nasal saline irrigation if needed.   Continue aeroallergen aeroallergen immunotherapy injections.  The dose will be adjusted appropriately given the recent large local reaction.  For large local reactions, apply ice, topical hydrocortisone 1% cream, and take levocetirizine (Xyzal) or fexofenadine (Allegra).  I have recommended taking Xyzal or Allegra on the morning prior to coming in for her allergy injections.   Return in about 6 months (around 12/10/2017), or if symptoms worsen or fail to improve.

## 2017-06-14 DIAGNOSIS — L409 Psoriasis, unspecified: Secondary | ICD-10-CM | POA: Diagnosis not present

## 2017-06-26 ENCOUNTER — Ambulatory Visit (INDEPENDENT_AMBULATORY_CARE_PROVIDER_SITE_OTHER): Payer: BLUE CROSS/BLUE SHIELD | Admitting: *Deleted

## 2017-06-26 DIAGNOSIS — J309 Allergic rhinitis, unspecified: Secondary | ICD-10-CM | POA: Diagnosis not present

## 2017-06-28 DIAGNOSIS — Z021 Encounter for pre-employment examination: Secondary | ICD-10-CM | POA: Diagnosis not present

## 2017-06-28 DIAGNOSIS — Z111 Encounter for screening for respiratory tuberculosis: Secondary | ICD-10-CM | POA: Diagnosis not present

## 2017-06-28 DIAGNOSIS — Z23 Encounter for immunization: Secondary | ICD-10-CM | POA: Diagnosis not present

## 2017-06-29 DIAGNOSIS — D2312 Other benign neoplasm of skin of left eyelid, including canthus: Secondary | ICD-10-CM | POA: Diagnosis not present

## 2017-07-05 ENCOUNTER — Ambulatory Visit (INDEPENDENT_AMBULATORY_CARE_PROVIDER_SITE_OTHER): Payer: BLUE CROSS/BLUE SHIELD | Admitting: *Deleted

## 2017-07-05 DIAGNOSIS — J309 Allergic rhinitis, unspecified: Secondary | ICD-10-CM

## 2017-07-10 ENCOUNTER — Ambulatory Visit (INDEPENDENT_AMBULATORY_CARE_PROVIDER_SITE_OTHER): Payer: BLUE CROSS/BLUE SHIELD

## 2017-07-10 DIAGNOSIS — J309 Allergic rhinitis, unspecified: Secondary | ICD-10-CM

## 2017-07-18 ENCOUNTER — Ambulatory Visit (INDEPENDENT_AMBULATORY_CARE_PROVIDER_SITE_OTHER): Payer: BLUE CROSS/BLUE SHIELD | Admitting: *Deleted

## 2017-07-18 DIAGNOSIS — J309 Allergic rhinitis, unspecified: Secondary | ICD-10-CM

## 2017-07-18 DIAGNOSIS — F338 Other recurrent depressive disorders: Secondary | ICD-10-CM | POA: Diagnosis not present

## 2017-07-18 DIAGNOSIS — R41840 Attention and concentration deficit: Secondary | ICD-10-CM | POA: Diagnosis not present

## 2017-07-18 DIAGNOSIS — Z79899 Other long term (current) drug therapy: Secondary | ICD-10-CM | POA: Diagnosis not present

## 2017-07-18 DIAGNOSIS — F419 Anxiety disorder, unspecified: Secondary | ICD-10-CM | POA: Diagnosis not present

## 2017-07-24 ENCOUNTER — Encounter: Payer: Self-pay | Admitting: *Deleted

## 2017-07-24 NOTE — Progress Notes (Signed)
This encounter was created in error - please disregard.

## 2017-07-31 ENCOUNTER — Ambulatory Visit (INDEPENDENT_AMBULATORY_CARE_PROVIDER_SITE_OTHER): Payer: BLUE CROSS/BLUE SHIELD | Admitting: *Deleted

## 2017-07-31 DIAGNOSIS — J309 Allergic rhinitis, unspecified: Secondary | ICD-10-CM | POA: Diagnosis not present

## 2017-08-07 ENCOUNTER — Ambulatory Visit (INDEPENDENT_AMBULATORY_CARE_PROVIDER_SITE_OTHER): Payer: BLUE CROSS/BLUE SHIELD | Admitting: *Deleted

## 2017-08-07 DIAGNOSIS — J309 Allergic rhinitis, unspecified: Secondary | ICD-10-CM

## 2017-08-14 ENCOUNTER — Ambulatory Visit (INDEPENDENT_AMBULATORY_CARE_PROVIDER_SITE_OTHER): Payer: BLUE CROSS/BLUE SHIELD | Admitting: *Deleted

## 2017-08-14 DIAGNOSIS — J309 Allergic rhinitis, unspecified: Secondary | ICD-10-CM | POA: Diagnosis not present

## 2017-08-17 DIAGNOSIS — F330 Major depressive disorder, recurrent, mild: Secondary | ICD-10-CM | POA: Diagnosis not present

## 2017-08-21 ENCOUNTER — Ambulatory Visit (INDEPENDENT_AMBULATORY_CARE_PROVIDER_SITE_OTHER): Payer: BLUE CROSS/BLUE SHIELD

## 2017-08-21 DIAGNOSIS — F419 Anxiety disorder, unspecified: Secondary | ICD-10-CM | POA: Diagnosis not present

## 2017-08-21 DIAGNOSIS — J309 Allergic rhinitis, unspecified: Secondary | ICD-10-CM

## 2017-08-21 DIAGNOSIS — F900 Attention-deficit hyperactivity disorder, predominantly inattentive type: Secondary | ICD-10-CM | POA: Diagnosis not present

## 2017-08-21 DIAGNOSIS — Z79899 Other long term (current) drug therapy: Secondary | ICD-10-CM | POA: Diagnosis not present

## 2017-08-21 DIAGNOSIS — F338 Other recurrent depressive disorders: Secondary | ICD-10-CM | POA: Diagnosis not present

## 2017-08-30 ENCOUNTER — Telehealth: Payer: Self-pay | Admitting: Vascular Surgery

## 2017-08-30 ENCOUNTER — Ambulatory Visit (INDEPENDENT_AMBULATORY_CARE_PROVIDER_SITE_OTHER): Payer: BLUE CROSS/BLUE SHIELD | Admitting: *Deleted

## 2017-08-30 DIAGNOSIS — J309 Allergic rhinitis, unspecified: Secondary | ICD-10-CM

## 2017-08-30 NOTE — Telephone Encounter (Signed)
Sched lab 09/10/17 at 1:00 and MD 09/17/17 at 1:45. Lm on hm#.

## 2017-08-30 NOTE — Telephone Encounter (Signed)
-----  Message from Rolla Flatten, RN sent at 08/30/2017 12:21 PM EST ----- She needs a bilat reflux study (has to leave here by 12;30 to pickup a preschooler. And then a VV appt with [facility]. She saw him in Jan of 2018. This doesn't have to happen on one day but the study needs to happen before 12/31 as she has met her deductible. Thx!

## 2017-08-31 ENCOUNTER — Other Ambulatory Visit: Payer: Self-pay

## 2017-08-31 DIAGNOSIS — I83893 Varicose veins of bilateral lower extremities with other complications: Secondary | ICD-10-CM

## 2017-09-06 ENCOUNTER — Ambulatory Visit (INDEPENDENT_AMBULATORY_CARE_PROVIDER_SITE_OTHER): Payer: BLUE CROSS/BLUE SHIELD | Admitting: Allergy

## 2017-09-06 DIAGNOSIS — J309 Allergic rhinitis, unspecified: Secondary | ICD-10-CM | POA: Diagnosis not present

## 2017-09-10 ENCOUNTER — Encounter (HOSPITAL_COMMUNITY): Payer: BLUE CROSS/BLUE SHIELD

## 2017-09-11 ENCOUNTER — Encounter (HOSPITAL_COMMUNITY): Payer: BLUE CROSS/BLUE SHIELD

## 2017-09-17 ENCOUNTER — Ambulatory Visit: Payer: BLUE CROSS/BLUE SHIELD | Admitting: Vascular Surgery

## 2017-09-18 ENCOUNTER — Ambulatory Visit (INDEPENDENT_AMBULATORY_CARE_PROVIDER_SITE_OTHER): Payer: BLUE CROSS/BLUE SHIELD | Admitting: *Deleted

## 2017-09-18 DIAGNOSIS — J309 Allergic rhinitis, unspecified: Secondary | ICD-10-CM | POA: Diagnosis not present

## 2017-09-19 ENCOUNTER — Ambulatory Visit: Payer: BLUE CROSS/BLUE SHIELD | Admitting: Vascular Surgery

## 2017-09-19 ENCOUNTER — Encounter: Payer: Self-pay | Admitting: *Deleted

## 2017-09-19 DIAGNOSIS — J301 Allergic rhinitis due to pollen: Secondary | ICD-10-CM | POA: Diagnosis not present

## 2017-09-19 NOTE — Progress Notes (Signed)
VIALS MADE. EXP: 09-19-18. HV 

## 2017-10-01 ENCOUNTER — Ambulatory Visit (INDEPENDENT_AMBULATORY_CARE_PROVIDER_SITE_OTHER): Payer: BLUE CROSS/BLUE SHIELD

## 2017-10-01 ENCOUNTER — Ambulatory Visit (HOSPITAL_COMMUNITY)
Admission: RE | Admit: 2017-10-01 | Discharge: 2017-10-01 | Disposition: A | Discharge status: Home / Self Care | Payer: BLUE CROSS/BLUE SHIELD | Source: Ambulatory Visit | Attending: Vascular Surgery | Admitting: Vascular Surgery

## 2017-10-01 DIAGNOSIS — I83893 Varicose veins of bilateral lower extremities with other complications: Secondary | ICD-10-CM | POA: Diagnosis not present

## 2017-10-01 DIAGNOSIS — J309 Allergic rhinitis, unspecified: Secondary | ICD-10-CM | POA: Diagnosis not present

## 2017-10-04 DIAGNOSIS — F900 Attention-deficit hyperactivity disorder, predominantly inattentive type: Secondary | ICD-10-CM | POA: Diagnosis not present

## 2017-10-04 DIAGNOSIS — Z79899 Other long term (current) drug therapy: Secondary | ICD-10-CM | POA: Diagnosis not present

## 2017-10-04 DIAGNOSIS — F338 Other recurrent depressive disorders: Secondary | ICD-10-CM | POA: Diagnosis not present

## 2017-10-04 DIAGNOSIS — F419 Anxiety disorder, unspecified: Secondary | ICD-10-CM | POA: Diagnosis not present

## 2017-10-09 ENCOUNTER — Ambulatory Visit: Payer: BLUE CROSS/BLUE SHIELD | Admitting: Vascular Surgery

## 2017-10-11 ENCOUNTER — Ambulatory Visit (INDEPENDENT_AMBULATORY_CARE_PROVIDER_SITE_OTHER): Payer: BLUE CROSS/BLUE SHIELD | Admitting: *Deleted

## 2017-10-11 DIAGNOSIS — J309 Allergic rhinitis, unspecified: Secondary | ICD-10-CM | POA: Diagnosis not present

## 2017-10-16 ENCOUNTER — Ambulatory Visit (INDEPENDENT_AMBULATORY_CARE_PROVIDER_SITE_OTHER): Payer: BLUE CROSS/BLUE SHIELD | Admitting: *Deleted

## 2017-10-16 DIAGNOSIS — J309 Allergic rhinitis, unspecified: Secondary | ICD-10-CM

## 2017-10-23 ENCOUNTER — Encounter: Payer: Self-pay | Admitting: Vascular Surgery

## 2017-10-23 ENCOUNTER — Ambulatory Visit (INDEPENDENT_AMBULATORY_CARE_PROVIDER_SITE_OTHER): Payer: BLUE CROSS/BLUE SHIELD | Admitting: *Deleted

## 2017-10-23 ENCOUNTER — Ambulatory Visit: Payer: BLUE CROSS/BLUE SHIELD | Admitting: Vascular Surgery

## 2017-10-23 VITALS — BP 134/96 | HR 75 | Temp 98.4°F | Resp 18 | Ht 64.0 in | Wt 152.7 lb

## 2017-10-23 DIAGNOSIS — I83893 Varicose veins of bilateral lower extremities with other complications: Secondary | ICD-10-CM | POA: Diagnosis not present

## 2017-10-23 DIAGNOSIS — J309 Allergic rhinitis, unspecified: Secondary | ICD-10-CM

## 2017-10-23 NOTE — Progress Notes (Signed)
Subjective:     Patient ID: Karen Ortiz, female   DOB: Feb 20, 1976, 42 y.o.   MRN: [id number]  HPI This 42 year old female returns for further discussion regarding her bilateral aching discomfort in the lower extremities with some edema. She was evaluated by me in January 2018 and the veins were felt to be too small for laser ablation. She did have some foam sclerotherapy in April 2018 by Marisue Ivan. Now she returns with some aching discomfort in both legs. She has no history of stasis ulcer bleeding DVT thrombophlebitis. She does not wear elastic compression stockings  Past Medical History:  Diagnosis Date  . Gestational diabetes   . Hx of varicella     Social History   Tobacco Use  . Smoking status: Never Smoker  . Smokeless tobacco: Never Used  Substance Use Topics  . Alcohol use: No    Family History  Problem Relation Age of Onset  . Heart murmur Mother   . Heart disease Mother   . Migraines Mother   . Alcohol abuse Father   . Dementia Maternal Grandmother   . Heart disease Maternal Grandfather   . Heart attack Maternal Grandfather   . Cancer Paternal Grandmother        skin and leukemia    No Known Allergies   Current Outpatient Medications:  .  atomoxetine (STRATTERA) 25 MG capsule, Take 25 mg by mouth 2 (two) times daily with a meal., Disp: , Rfl:  .  Azelastine-Fluticasone (DYMISTA) 137-50 MCG/ACT SUSP, Place 1 spray into both nostrils 2 (two) times daily. (Patient not taking: Reported on 10/23/2017), Disp: 1 Bottle, Rfl: 5 .  buPROPion (WELLBUTRIN XL) 300 MG 24 hr tablet, , Disp: , Rfl:  .  Cholecalciferol (VITAMIN D PO), Take 5,000 Units by mouth., Disp: , Rfl:  .  clobetasol cream (TEMOVATE) 0.05 %, , Disp: , Rfl:  .  CONCERTA 36 MG CR tablet, , Disp: , Rfl:  .  Ferrous Gluconate (IRON 27 PO), Take by mouth., Disp: , Rfl:  .  Ferrous Sulfate (IRON) 325 (65 Fe) MG TABS, Take by mouth., Disp: , Rfl:  .  ibuprofen (ADVIL,MOTRIN) 600 MG tablet, Take 1 tablet (600 mg  total) by mouth every 6 (six) hours. (Patient not taking: Reported on 10/23/2017), Disp: 30 tablet, Rfl: 0 .  ketoconazole (NIZORAL) 2 % shampoo, Apply 2 application topically as needed., Disp: , Rfl:  .  levocetirizine (XYZAL) 5 MG tablet, Take 1 tablet (5 mg total) by mouth as needed for allergies., Disp: 90 tablet, Rfl: 1 .  Norgestimate-Ethinyl Estradiol Triphasic (TRINESSA LO) 0.18/0.215/0.25 MG-25 MCG tab, Take 0.18 mg by mouth daily., Disp: , Rfl:  .  Omega 3-6-9 Fatty Acids (OMEGA-3-6-9 PO), Take by mouth., Disp: , Rfl:   Vitals:   10/23/17 1026 10/23/17 1027  BP: (!) 129/102 (!) 134/96  Pulse: 75   Resp: 18   Temp: 98.4 F (36.9 C)   TempSrc: Oral   SpO2: 100%   Weight: 152 lb 11.2 oz (69.3 kg)   Height: 5\' 4"  (1.626 m)     Body mass index is 26.21 kg/m.        Review of Systems Denies chest pain, dyspnea on exertion, PND, orthopnea, hemoptysis    Objective:   Physical Exam BP (!) 134/96 (BP Location: Left Arm, Patient Position: Sitting, Cuff Size: Normal)   Pulse 75   Temp 98.4 F (36.9 C) (Oral)   Resp 18   Ht 5\' 4"  (1.626 m)   Wt  152 lb 11.2 oz (69.3 kg)   SpO2 100%   BMI 26.21 kg/m   Gen. well-developed well-nourished female no apparent distress alert and oriented 3 Lungs no rhonchi or wheezing Very small varicosities in the right medial distal thigh and a few spider clusters in the right medial and left medial calf proximally. No significant edema hyperpigmentation or ulceration noted distally.  Today I performed a bedside SonoSite ultrasound exam which revealed normal-size saphenous vein with no definite reflux We did have a formal venous reflux exam performed on 10/01/2017 which did not reveal large-caliber veins but did have some reflux bilaterally     Assessment:     Bilateral pain and minimal swelling with no evidence of severe reflux and bilateral great saphenous veins    Plan:     #1 elevate foot of bed 2-3 inches #2 short leg elastic  compression stockings to be placed first thing in the morning #3 no further suggestions return on a when necessary basis-no indication for any intervention

## 2017-10-30 ENCOUNTER — Ambulatory Visit (INDEPENDENT_AMBULATORY_CARE_PROVIDER_SITE_OTHER): Payer: BLUE CROSS/BLUE SHIELD | Admitting: *Deleted

## 2017-10-30 DIAGNOSIS — J309 Allergic rhinitis, unspecified: Secondary | ICD-10-CM | POA: Diagnosis not present

## 2017-11-09 ENCOUNTER — Ambulatory Visit (INDEPENDENT_AMBULATORY_CARE_PROVIDER_SITE_OTHER): Payer: BLUE CROSS/BLUE SHIELD

## 2017-11-09 DIAGNOSIS — J309 Allergic rhinitis, unspecified: Secondary | ICD-10-CM | POA: Diagnosis not present

## 2017-11-09 NOTE — Progress Notes (Signed)
Patient has been advised of this. Thank you.

## 2017-11-09 NOTE — Progress Notes (Signed)
Ok thanks for the update.   Would advise that she take her antihistamine twice a day especially days following her injections.

## 2017-11-09 NOTE — Progress Notes (Signed)
Patient came in for injections and still had large red area on arms from last injection. She said that there was a very large hive on arms where injection was given. She said that she has told the injection nurse/cma before, but sometimes she does forget. I explained to her that we do need to know when things like that happen, even if it is a day later. We have to change her injection amount per our protocol and we want to do what is safest for her. Patient will tell us from now on if there is any kind of reaction with her injections. I did back her down to 0.1 cc on both vials today.

## 2017-11-11 IMAGING — MG DIGITAL SCREENING BILATERAL MAMMOGRAM WITH CAD
4 series · 4 of 4 positions shown · non-contrast
Comparison: None.

CLINICAL DATA: Screening.

EXAM:
DIGITAL SCREENING BILATERAL MAMMOGRAM WITH CAD

[L MLO]
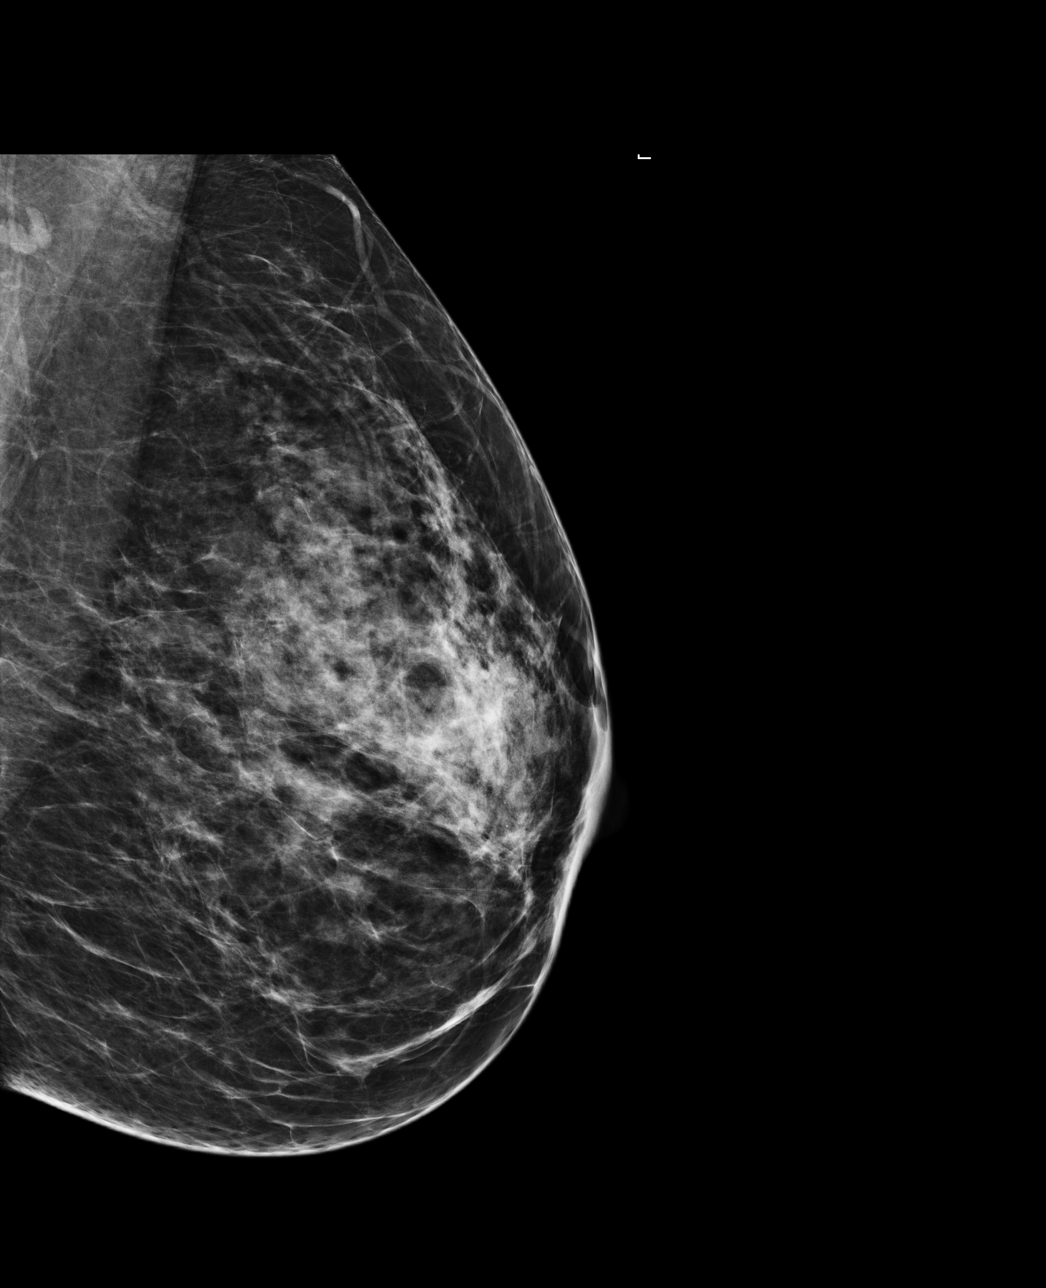

[R CC]
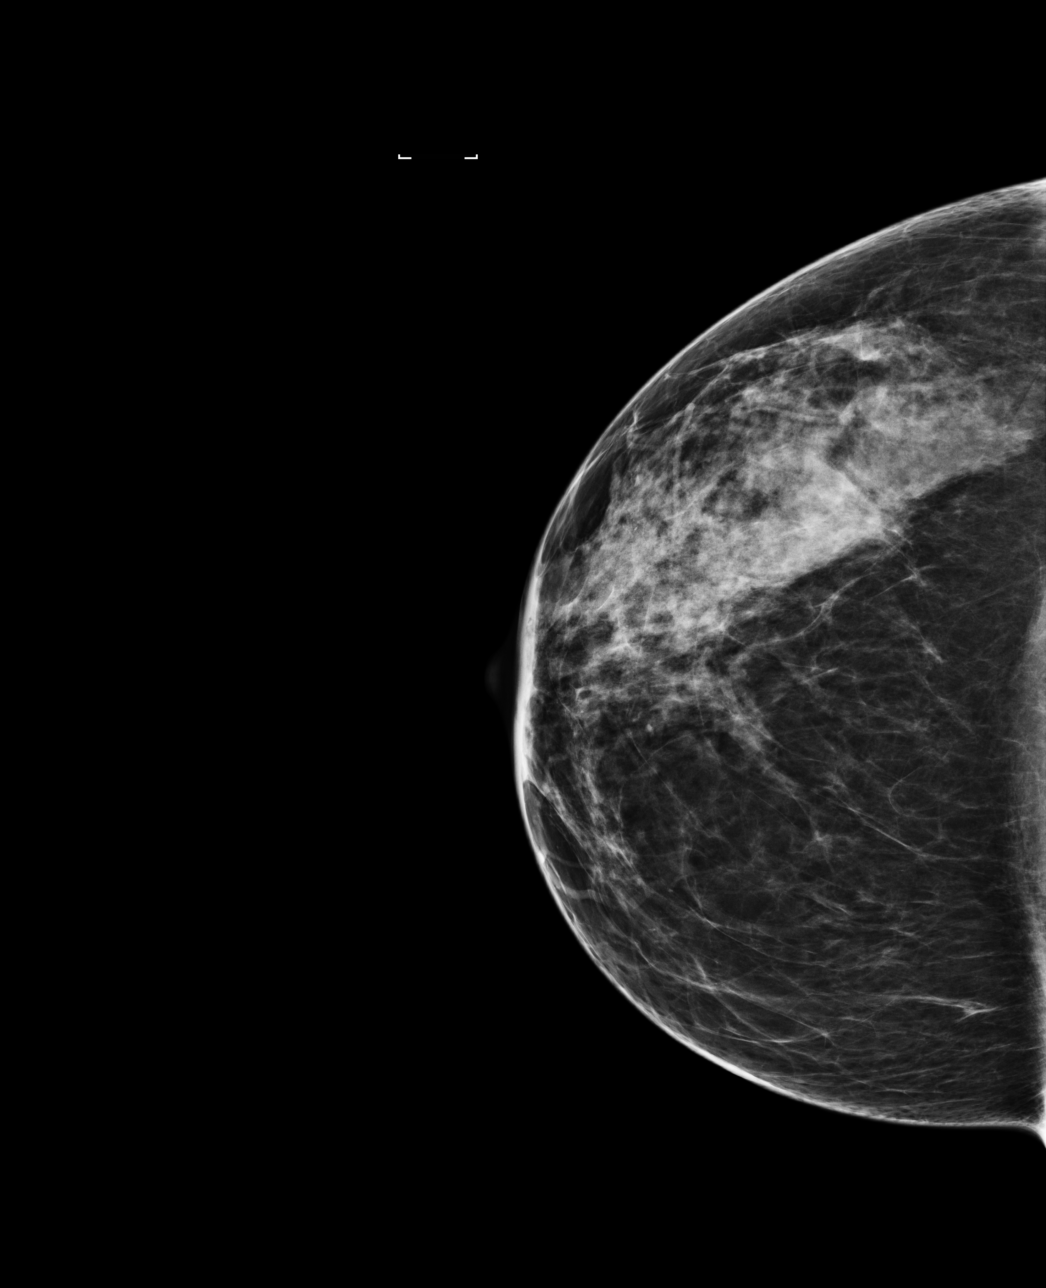

[R MLO]
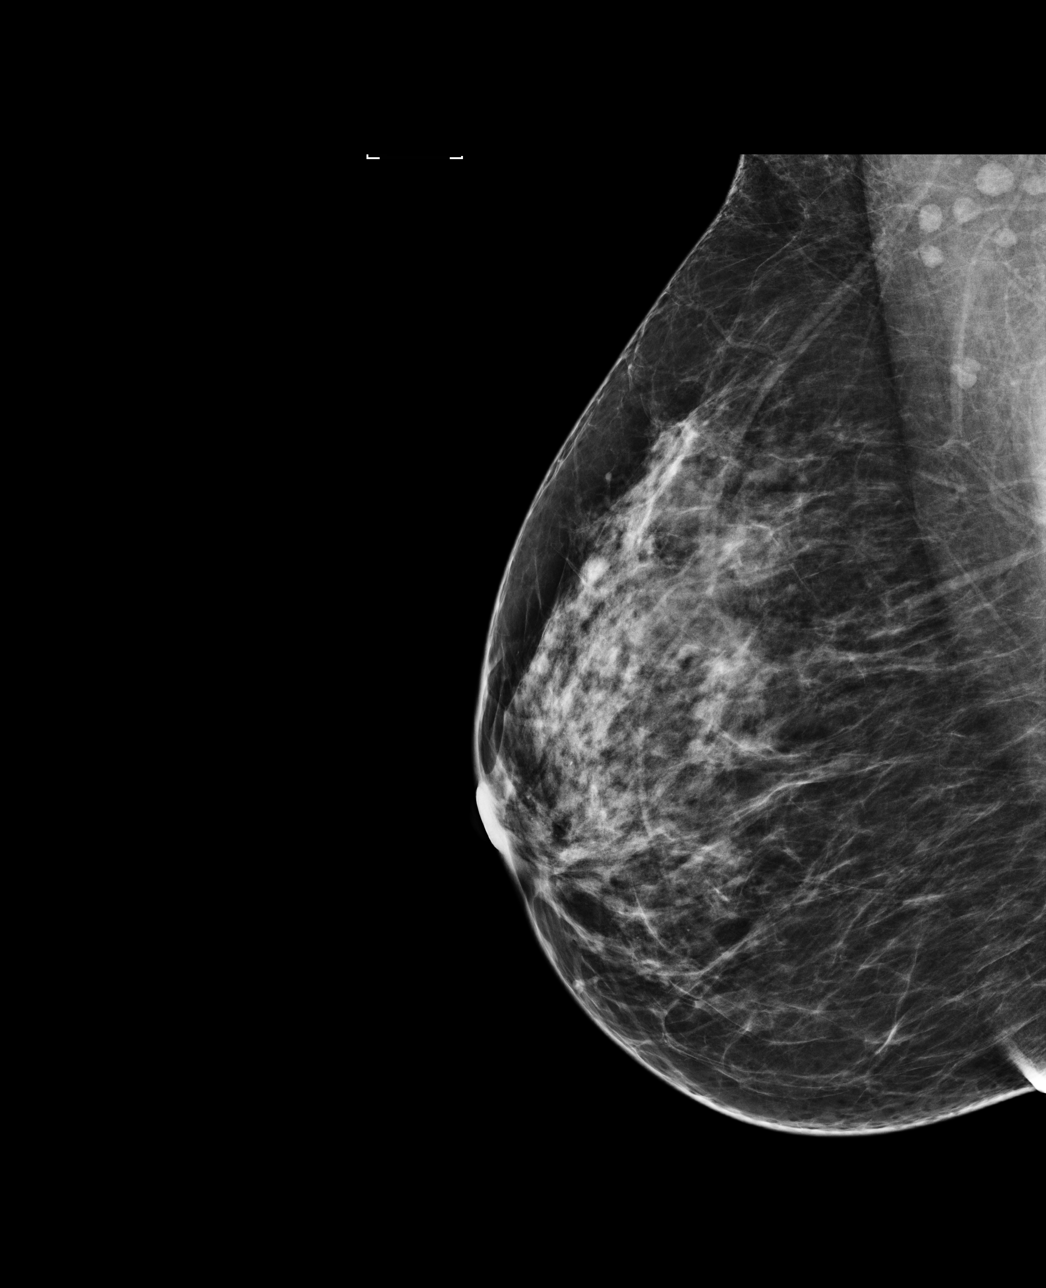

[L CC]
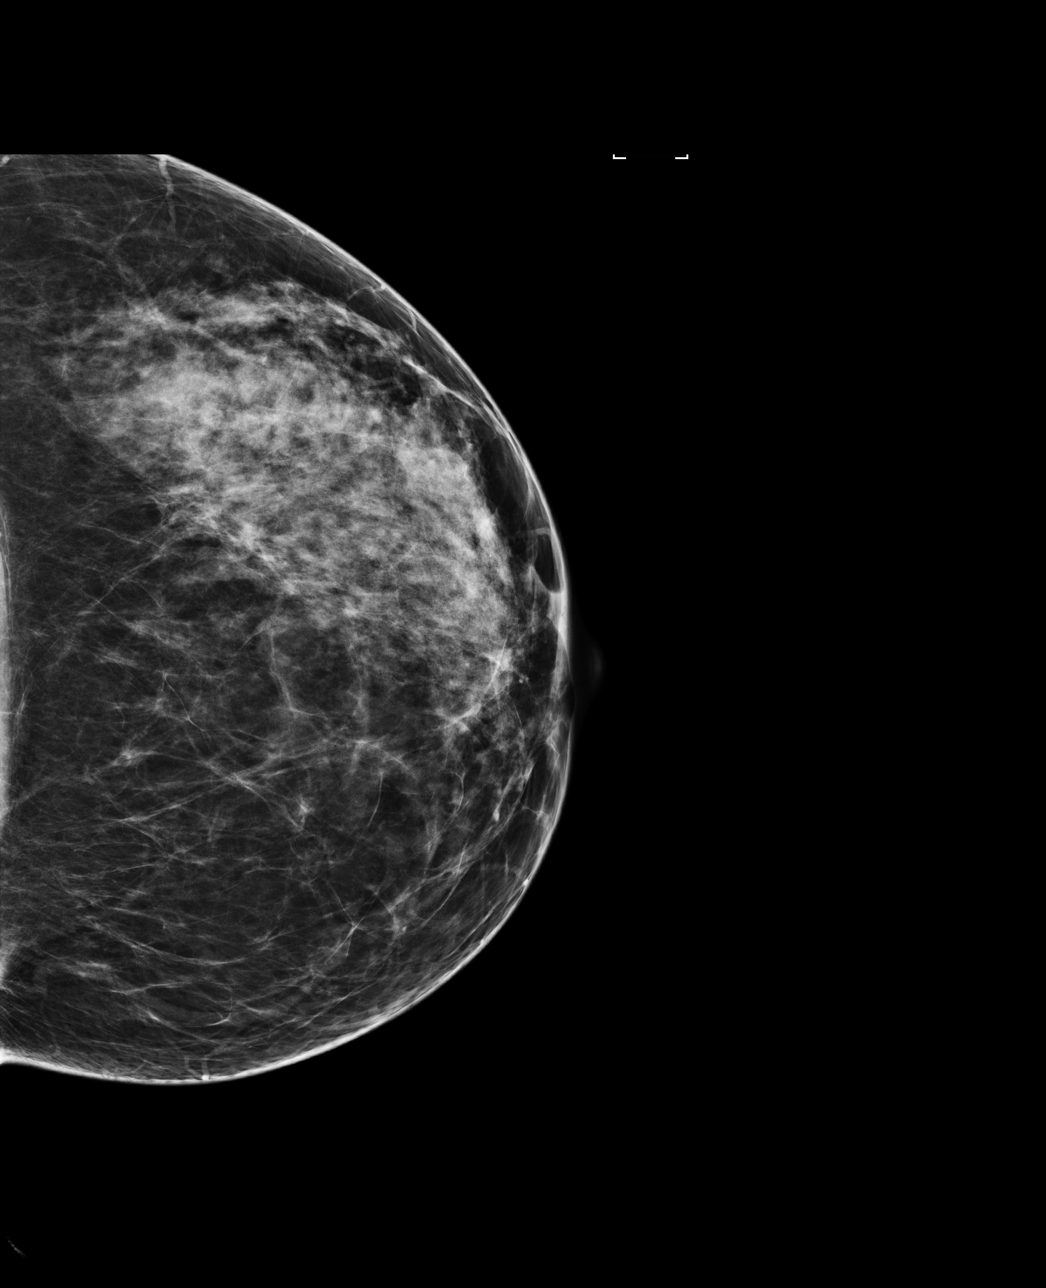

[4 of 4 positions shown; findings below may reference images not displayed]

ACR Breast Density Category c: The breast tissue is heterogeneously
dense, which may obscure small masses
FINDINGS: There are no findings suspicious for malignancy. Images were
processed with CAD.
IMPRESSION: No mammographic evidence of malignancy. A result letter of this
screening mammogram will be mailed directly to the patient.

RECOMMENDATION:
Screening mammogram in one year. (Code:[id number])

BI-RADS CATEGORY  1: Negative.

## 2017-11-14 ENCOUNTER — Ambulatory Visit (INDEPENDENT_AMBULATORY_CARE_PROVIDER_SITE_OTHER): Payer: BLUE CROSS/BLUE SHIELD | Admitting: *Deleted

## 2017-11-14 DIAGNOSIS — J309 Allergic rhinitis, unspecified: Secondary | ICD-10-CM | POA: Diagnosis not present

## 2017-11-20 ENCOUNTER — Ambulatory Visit (INDEPENDENT_AMBULATORY_CARE_PROVIDER_SITE_OTHER): Payer: BLUE CROSS/BLUE SHIELD | Admitting: *Deleted

## 2017-11-20 ENCOUNTER — Other Ambulatory Visit: Payer: Self-pay | Admitting: *Deleted

## 2017-11-20 ENCOUNTER — Other Ambulatory Visit: Payer: Self-pay | Admitting: Allergy and Immunology

## 2017-11-20 DIAGNOSIS — J309 Allergic rhinitis, unspecified: Secondary | ICD-10-CM | POA: Diagnosis not present

## 2017-11-20 DIAGNOSIS — J3089 Other allergic rhinitis: Secondary | ICD-10-CM

## 2017-11-20 MED ORDER — LEVOCETIRIZINE DIHYDROCHLORIDE 5 MG PO TABS
5.0000 mg | ORAL_TABLET | ORAL | 1 refills | Status: DC | PRN
Start: 2017-11-20 — End: 2019-08-11

## 2017-11-20 NOTE — Telephone Encounter (Signed)
Patient needs a 90 day refill on levocetrizine sent into [facility]

## 2017-11-20 NOTE — Telephone Encounter (Signed)
This prescription has been sent.

## 2017-12-06 ENCOUNTER — Ambulatory Visit (INDEPENDENT_AMBULATORY_CARE_PROVIDER_SITE_OTHER): Payer: BLUE CROSS/BLUE SHIELD | Admitting: *Deleted

## 2017-12-06 DIAGNOSIS — J309 Allergic rhinitis, unspecified: Secondary | ICD-10-CM

## 2017-12-11 ENCOUNTER — Ambulatory Visit: Payer: BLUE CROSS/BLUE SHIELD | Admitting: Allergy and Immunology

## 2017-12-11 ENCOUNTER — Encounter: Payer: Self-pay | Admitting: Allergy and Immunology

## 2017-12-11 VITALS — BP 120/70 | HR 63 | Resp 17

## 2017-12-11 DIAGNOSIS — H6983 Other specified disorders of Eustachian tube, bilateral: Secondary | ICD-10-CM

## 2017-12-11 DIAGNOSIS — J3089 Other allergic rhinitis: Secondary | ICD-10-CM | POA: Diagnosis not present

## 2017-12-11 MED ORDER — FLUTICASONE PROPIONATE 93 MCG/ACT NA EXHU: 2.0000 | INHALANT_SUSPENSION | Freq: Two times a day (BID) | NASAL | 5 refills | Status: DC

## 2017-12-11 MED ORDER — CARBINOXAMINE MALEATE 6 MG PO TABS: 6.0000 mg | ORAL_TABLET | Freq: Four times a day (QID) | ORAL | 5 refills | Status: DC | PRN

## 2017-12-11 NOTE — Patient Instructions (Addendum)
Perennial and seasonal allergic rhinitis  Continue appropriate allergen avoidance measures and immunotherapy as prescribed and as tolerated.  A prescription has been provided for RyVent (carbinoxamine maleate) 6mg  every 6-8 hours as needed.  Discontinue levocetirizine while taking RyVent.  [facility], 2 actuations per nostril twice a day. Proper technique has been discussed and demonstrated.  Nasal saline spray (i.e., Simply Saline) or nasal saline lavage (i.e., NeilMed) is recommended as needed and prior to medicated nasal sprays.  Eustachian tube dysfunction  Treatment plan as outlined above for allergic rhinitis.  If needed, may add pseudoephedrine as needed.  This medication is only to be used for short-term relief as long-term use may lead to untoward side effects.   Return in about 6 months (around 06/13/2018), or if symptoms worsen or fail to improve.

## 2017-12-11 NOTE — Assessment & Plan Note (Signed)
   Treatment plan as outlined above for allergic rhinitis.  If needed, may add pseudoephedrine as needed.  This medication is only to be used for short-term relief as long-term use may lead to untoward side effects.

## 2017-12-11 NOTE — Progress Notes (Signed)
Follow-up Note  RE: Karen Ortiz [place] MRN: [id number] DOB: 10/04/1975 Date of Office Visit: 12/11/2017  Primary care provider: Lewis Moccasinewey, Elizabeth R, MD Referring provider: Lewis Moccasinewey, Elizabeth R, MD  History of present illness: Karen Ortiz Thalmann is a 42 y.o. female with allergic rhinitis, history of eustachian tube dysfunction, and history of food allergy presenting today for follow up.  She was last seen in this clinic in September 2018.  She is currently receiving aeroallergen immunotherapy injections and denies any large local reactions in the interval since her previous visit.  She has been experiencing some postnasal drainage despite taking levocetirizine daily.  She admits that she has not been using a medicated nasal spray or nasal saline irrigation.   Assessment and plan: Perennial and seasonal allergic rhinitis  Continue appropriate allergen avoidance measures and immunotherapy as prescribed and as tolerated.  A prescription has been provided for RyVent (carbinoxamine maleate) 6mg  every 6-8 hours as needed.  Discontinue levocetirizine while taking RyVent.  [facility], 2 actuations per nostril twice a day. Proper technique has been discussed and demonstrated.  Nasal saline spray (i.e., Simply Saline) or nasal saline lavage (i.e., NeilMed) is recommended as needed and prior to medicated nasal sprays.  Eustachian tube dysfunction  Treatment plan as outlined above for allergic rhinitis.  If needed, may add pseudoephedrine as needed.  This medication is only to be used for short-term relief as long-term use may lead to untoward side effects.   Meds ordered this encounter  Medications  . Carbinoxamine Maleate (RYVENT) 6 MG TABS    Sig: Take 6 mg by mouth every 6 (six) hours as needed.    Dispense:  30 tablet    Refill:  5    RUN AS CASH PAY ONLY! BIN  [id number]  RXPCN  [id number]  Group  [id number]  CardholderID  [id number]  . Fluticasone Propionate  (XHANCE) 93 MCG/ACT EXHU    Sig: Place 2 sprays into the nose 2 (two) times daily.    Dispense:  16 mL    Refill:  5    [phone] [id number] (M)    Physical examination: Blood pressure 120/70, pulse 63, resp. rate 17, SpO2 99 %, unknown if currently breastfeeding.  General: Alert, interactive, in no acute distress. HEENT: TMs pearly gray, turbinates mildly edematous without discharge, post-pharynx mildly erythematous. Neck: Supple without lymphadenopathy. Lungs: Clear to auscultation without wheezing, rhonchi or rales. CV: Normal S1, S2 without murmurs. Skin: Warm and dry, without lesions or rashes.  The following portions of the patient's history were reviewed and updated as appropriate: allergies, current medications, past family history, past medical history, past social history, past surgical history and problem list.  Allergies as of 12/11/2017   No Known Allergies     Medication List        Accurate as of 12/11/17 11:37 AM. Always use your most recent med list.          Azelastine-Fluticasone 137-50 MCG/ACT Susp Commonly known as:  DYMISTA Place 1 spray into both nostrils 2 (two) times daily.   buPROPion 300 MG 24 hr tablet Commonly known as:  WELLBUTRIN XL   Carbinoxamine Maleate 6 MG Tabs Commonly known as:  RYVENT Take 6 mg by mouth every 6 (six) hours as needed.   clobetasol cream 0.05 % Commonly known as:  TEMOVATE   Fluticasone Propionate 93 MCG/ACT Exhu Commonly known as:  XHANCE Place 2 sprays into the nose 2 (two) times daily.   ibuprofen 600 MG tablet  Commonly known as:  ADVIL,MOTRIN Take 1 tablet (600 mg total) by mouth every 6 (six) hours.   ketoconazole 2 % shampoo Commonly known as:  NIZORAL Apply 2 application topically as needed.   levocetirizine 5 MG tablet Commonly known as:  XYZAL Take 1 tablet (5 mg total) by mouth as needed for allergies.   RECLIPSEN 0.15-30 MG-MCG tablet Generic drug:  desogestrel-ethinyl estradiol   VITAMIN D PO Take  5,000 Units by mouth.       No Known Allergies  I appreciate the opportunity to take part in Berneita's care. Please do not hesitate to contact me with questions.  Sincerely,   R. Jorene Guest, MD

## 2017-12-11 NOTE — Assessment & Plan Note (Signed)
   Continue appropriate allergen avoidance measures and immunotherapy as prescribed and as tolerated.  A prescription has been provided for RyVent (carbinoxamine maleate) 6mg  every 6-8 hours as needed.  Discontinue levocetirizine while taking RyVent.  A prescription has been provided [facility], 2 actuations per nostril twice a day. Proper technique has been discussed and demonstrated.  Nasal saline spray (i.e., Simply Saline) or nasal saline lavage (i.e., NeilMed) is recommended as needed and prior to medicated nasal sprays.

## 2017-12-11 NOTE — Addendum Note (Signed)
Addended by: Dub MikesHICKS, Deyanna Mctier N on: 12/11/2017 11:58 AM   Modules accepted: Orders

## 2017-12-19 ENCOUNTER — Other Ambulatory Visit: Payer: Self-pay

## 2017-12-19 ENCOUNTER — Ambulatory Visit (INDEPENDENT_AMBULATORY_CARE_PROVIDER_SITE_OTHER): Payer: BLUE CROSS/BLUE SHIELD | Admitting: *Deleted

## 2017-12-19 DIAGNOSIS — J3089 Other allergic rhinitis: Secondary | ICD-10-CM

## 2017-12-19 DIAGNOSIS — J309 Allergic rhinitis, unspecified: Secondary | ICD-10-CM

## 2017-12-19 MED ORDER — CARBINOXAMINE MALEATE 6 MG PO TABS
6.0000 mg | ORAL_TABLET | Freq: Four times a day (QID) | ORAL | 1 refills | Status: DC | PRN
Start: 2017-12-19 — End: 2018-06-04

## 2017-12-19 NOTE — Telephone Encounter (Signed)
Patient was seen on 12/11/2017 by Dr Nunzio CobbsBobbitt. She did not receive her Ryvent. [facility] due to her insurance.

## 2017-12-19 NOTE — Telephone Encounter (Signed)
Called and informed patient. 

## 2017-12-19 NOTE — Telephone Encounter (Signed)
Resent prescription to [facility]

## 2017-12-25 DIAGNOSIS — Z79899 Other long term (current) drug therapy: Secondary | ICD-10-CM | POA: Diagnosis not present

## 2017-12-25 DIAGNOSIS — F338 Other recurrent depressive disorders: Secondary | ICD-10-CM | POA: Diagnosis not present

## 2017-12-25 DIAGNOSIS — F900 Attention-deficit hyperactivity disorder, predominantly inattentive type: Secondary | ICD-10-CM | POA: Diagnosis not present

## 2017-12-25 DIAGNOSIS — F419 Anxiety disorder, unspecified: Secondary | ICD-10-CM | POA: Diagnosis not present

## 2017-12-26 DIAGNOSIS — J0100 Acute maxillary sinusitis, unspecified: Secondary | ICD-10-CM | POA: Diagnosis not present

## 2018-01-02 ENCOUNTER — Ambulatory Visit (INDEPENDENT_AMBULATORY_CARE_PROVIDER_SITE_OTHER): Payer: BLUE CROSS/BLUE SHIELD | Admitting: *Deleted

## 2018-01-02 DIAGNOSIS — J309 Allergic rhinitis, unspecified: Secondary | ICD-10-CM | POA: Diagnosis not present

## 2018-01-10 ENCOUNTER — Ambulatory Visit (INDEPENDENT_AMBULATORY_CARE_PROVIDER_SITE_OTHER): Payer: BLUE CROSS/BLUE SHIELD | Admitting: *Deleted

## 2018-01-10 DIAGNOSIS — J309 Allergic rhinitis, unspecified: Secondary | ICD-10-CM | POA: Diagnosis not present

## 2018-01-16 ENCOUNTER — Ambulatory Visit (INDEPENDENT_AMBULATORY_CARE_PROVIDER_SITE_OTHER): Payer: BLUE CROSS/BLUE SHIELD | Admitting: *Deleted

## 2018-01-16 DIAGNOSIS — J309 Allergic rhinitis, unspecified: Secondary | ICD-10-CM

## 2018-01-29 ENCOUNTER — Ambulatory Visit (INDEPENDENT_AMBULATORY_CARE_PROVIDER_SITE_OTHER): Payer: BLUE CROSS/BLUE SHIELD | Admitting: *Deleted

## 2018-01-29 DIAGNOSIS — J309 Allergic rhinitis, unspecified: Secondary | ICD-10-CM

## 2018-02-05 ENCOUNTER — Ambulatory Visit (INDEPENDENT_AMBULATORY_CARE_PROVIDER_SITE_OTHER): Payer: BLUE CROSS/BLUE SHIELD | Admitting: *Deleted

## 2018-02-05 DIAGNOSIS — J309 Allergic rhinitis, unspecified: Secondary | ICD-10-CM

## 2018-02-14 ENCOUNTER — Ambulatory Visit (INDEPENDENT_AMBULATORY_CARE_PROVIDER_SITE_OTHER): Payer: BLUE CROSS/BLUE SHIELD

## 2018-02-14 DIAGNOSIS — J309 Allergic rhinitis, unspecified: Secondary | ICD-10-CM

## 2018-02-20 NOTE — Progress Notes (Signed)
VIALS EXP 02-21-19 

## 2018-02-21 ENCOUNTER — Ambulatory Visit (INDEPENDENT_AMBULATORY_CARE_PROVIDER_SITE_OTHER): Payer: BLUE CROSS/BLUE SHIELD | Admitting: *Deleted

## 2018-02-21 DIAGNOSIS — J309 Allergic rhinitis, unspecified: Secondary | ICD-10-CM

## 2018-02-22 DIAGNOSIS — J301 Allergic rhinitis due to pollen: Secondary | ICD-10-CM | POA: Diagnosis not present

## 2018-03-01 ENCOUNTER — Ambulatory Visit (INDEPENDENT_AMBULATORY_CARE_PROVIDER_SITE_OTHER): Payer: BLUE CROSS/BLUE SHIELD

## 2018-03-01 DIAGNOSIS — J309 Allergic rhinitis, unspecified: Secondary | ICD-10-CM

## 2018-03-07 ENCOUNTER — Ambulatory Visit (INDEPENDENT_AMBULATORY_CARE_PROVIDER_SITE_OTHER): Payer: BLUE CROSS/BLUE SHIELD

## 2018-03-07 DIAGNOSIS — J309 Allergic rhinitis, unspecified: Secondary | ICD-10-CM

## 2018-03-15 ENCOUNTER — Ambulatory Visit (INDEPENDENT_AMBULATORY_CARE_PROVIDER_SITE_OTHER): Payer: BLUE CROSS/BLUE SHIELD

## 2018-03-15 DIAGNOSIS — J309 Allergic rhinitis, unspecified: Secondary | ICD-10-CM

## 2018-03-18 DIAGNOSIS — Z0000 Encounter for general adult medical examination without abnormal findings: Secondary | ICD-10-CM | POA: Diagnosis not present

## 2018-03-18 DIAGNOSIS — Z114 Encounter for screening for human immunodeficiency virus [HIV]: Secondary | ICD-10-CM | POA: Diagnosis not present

## 2018-03-18 DIAGNOSIS — Z1329 Encounter for screening for other suspected endocrine disorder: Secondary | ICD-10-CM | POA: Diagnosis not present

## 2018-03-19 DIAGNOSIS — Z13220 Encounter for screening for lipoid disorders: Secondary | ICD-10-CM | POA: Diagnosis not present

## 2018-03-21 ENCOUNTER — Other Ambulatory Visit: Payer: Self-pay | Admitting: Family Medicine

## 2018-03-21 ENCOUNTER — Ambulatory Visit (INDEPENDENT_AMBULATORY_CARE_PROVIDER_SITE_OTHER): Payer: BLUE CROSS/BLUE SHIELD | Admitting: *Deleted

## 2018-03-21 DIAGNOSIS — Z0110 Encounter for examination of ears and hearing without abnormal findings: Secondary | ICD-10-CM | POA: Diagnosis not present

## 2018-03-21 DIAGNOSIS — E663 Overweight: Secondary | ICD-10-CM | POA: Diagnosis not present

## 2018-03-21 DIAGNOSIS — Z1231 Encounter for screening mammogram for malignant neoplasm of breast: Secondary | ICD-10-CM

## 2018-03-21 DIAGNOSIS — Z135 Encounter for screening for eye and ear disorders: Secondary | ICD-10-CM | POA: Diagnosis not present

## 2018-03-21 DIAGNOSIS — Z6827 Body mass index (BMI) 27.0-27.9, adult: Secondary | ICD-10-CM | POA: Diagnosis not present

## 2018-03-21 DIAGNOSIS — R0683 Snoring: Secondary | ICD-10-CM | POA: Diagnosis not present

## 2018-03-21 DIAGNOSIS — Z0000 Encounter for general adult medical examination without abnormal findings: Secondary | ICD-10-CM | POA: Diagnosis not present

## 2018-03-21 DIAGNOSIS — J309 Allergic rhinitis, unspecified: Secondary | ICD-10-CM

## 2018-03-26 DIAGNOSIS — F419 Anxiety disorder, unspecified: Secondary | ICD-10-CM | POA: Diagnosis not present

## 2018-03-26 DIAGNOSIS — Z79899 Other long term (current) drug therapy: Secondary | ICD-10-CM | POA: Diagnosis not present

## 2018-03-26 DIAGNOSIS — F338 Other recurrent depressive disorders: Secondary | ICD-10-CM | POA: Diagnosis not present

## 2018-03-26 DIAGNOSIS — F900 Attention-deficit hyperactivity disorder, predominantly inattentive type: Secondary | ICD-10-CM | POA: Diagnosis not present

## 2018-04-02 ENCOUNTER — Ambulatory Visit (INDEPENDENT_AMBULATORY_CARE_PROVIDER_SITE_OTHER): Payer: BLUE CROSS/BLUE SHIELD | Admitting: *Deleted

## 2018-04-02 DIAGNOSIS — J309 Allergic rhinitis, unspecified: Secondary | ICD-10-CM

## 2018-04-10 ENCOUNTER — Ambulatory Visit (INDEPENDENT_AMBULATORY_CARE_PROVIDER_SITE_OTHER): Payer: BLUE CROSS/BLUE SHIELD | Admitting: *Deleted

## 2018-04-10 DIAGNOSIS — J309 Allergic rhinitis, unspecified: Secondary | ICD-10-CM

## 2018-04-19 ENCOUNTER — Ambulatory Visit
Admission: RE | Admit: 2018-04-19 | Discharge: 2018-04-19 | Disposition: A | Discharge status: Home / Self Care | Payer: BLUE CROSS/BLUE SHIELD | Source: Ambulatory Visit | Attending: Family Medicine | Admitting: Family Medicine

## 2018-04-19 DIAGNOSIS — Z1231 Encounter for screening mammogram for malignant neoplasm of breast: Secondary | ICD-10-CM | POA: Diagnosis not present

## 2018-04-22 ENCOUNTER — Ambulatory Visit (INDEPENDENT_AMBULATORY_CARE_PROVIDER_SITE_OTHER): Payer: BLUE CROSS/BLUE SHIELD

## 2018-04-22 ENCOUNTER — Encounter: Payer: Self-pay | Admitting: Allergy and Immunology

## 2018-04-22 DIAGNOSIS — J309 Allergic rhinitis, unspecified: Secondary | ICD-10-CM | POA: Diagnosis not present

## 2018-04-22 DIAGNOSIS — L409 Psoriasis, unspecified: Secondary | ICD-10-CM | POA: Diagnosis not present

## 2018-05-01 ENCOUNTER — Ambulatory Visit (INDEPENDENT_AMBULATORY_CARE_PROVIDER_SITE_OTHER): Payer: BLUE CROSS/BLUE SHIELD | Admitting: *Deleted

## 2018-05-01 DIAGNOSIS — J309 Allergic rhinitis, unspecified: Secondary | ICD-10-CM

## 2018-05-09 ENCOUNTER — Ambulatory Visit (INDEPENDENT_AMBULATORY_CARE_PROVIDER_SITE_OTHER): Payer: BLUE CROSS/BLUE SHIELD | Admitting: *Deleted

## 2018-05-09 DIAGNOSIS — J309 Allergic rhinitis, unspecified: Secondary | ICD-10-CM

## 2018-05-21 ENCOUNTER — Ambulatory Visit (INDEPENDENT_AMBULATORY_CARE_PROVIDER_SITE_OTHER): Payer: BLUE CROSS/BLUE SHIELD | Admitting: *Deleted

## 2018-05-21 DIAGNOSIS — J309 Allergic rhinitis, unspecified: Secondary | ICD-10-CM | POA: Diagnosis not present

## 2018-05-29 DIAGNOSIS — Z79899 Other long term (current) drug therapy: Secondary | ICD-10-CM | POA: Diagnosis not present

## 2018-05-29 DIAGNOSIS — F900 Attention-deficit hyperactivity disorder, predominantly inattentive type: Secondary | ICD-10-CM | POA: Diagnosis not present

## 2018-05-29 DIAGNOSIS — F338 Other recurrent depressive disorders: Secondary | ICD-10-CM | POA: Diagnosis not present

## 2018-05-29 DIAGNOSIS — F419 Anxiety disorder, unspecified: Secondary | ICD-10-CM | POA: Diagnosis not present

## 2018-06-04 ENCOUNTER — Other Ambulatory Visit: Payer: Self-pay

## 2018-06-04 DIAGNOSIS — J3089 Other allergic rhinitis: Secondary | ICD-10-CM

## 2018-06-04 MED ORDER — CARBINOXAMINE MALEATE 6 MG PO TABS: 6.0000 mg | ORAL_TABLET | Freq: Four times a day (QID) | ORAL | 0 refills | Status: DC | PRN

## 2018-06-04 NOTE — Telephone Encounter (Signed)
Courtesy refill  

## 2018-06-12 ENCOUNTER — Ambulatory Visit (INDEPENDENT_AMBULATORY_CARE_PROVIDER_SITE_OTHER): Payer: BLUE CROSS/BLUE SHIELD | Admitting: *Deleted

## 2018-06-12 DIAGNOSIS — J309 Allergic rhinitis, unspecified: Secondary | ICD-10-CM | POA: Diagnosis not present

## 2018-06-19 ENCOUNTER — Ambulatory Visit (INDEPENDENT_AMBULATORY_CARE_PROVIDER_SITE_OTHER): Payer: BLUE CROSS/BLUE SHIELD | Admitting: *Deleted

## 2018-06-19 DIAGNOSIS — J309 Allergic rhinitis, unspecified: Secondary | ICD-10-CM | POA: Diagnosis not present

## 2018-06-28 ENCOUNTER — Ambulatory Visit (INDEPENDENT_AMBULATORY_CARE_PROVIDER_SITE_OTHER): Payer: BLUE CROSS/BLUE SHIELD

## 2018-06-28 DIAGNOSIS — J309 Allergic rhinitis, unspecified: Secondary | ICD-10-CM

## 2018-06-28 MED ORDER — EPINEPHRINE 0.3 MG/0.3ML IJ SOAJ: 0.3000 mg | Freq: Once | INTRAMUSCULAR | 1 refills | Status: AC

## 2018-06-28 NOTE — Progress Notes (Signed)
Patient stated that she would not be staying today and I asked her if she had her epi pen and she stated that she does not have one. She stated she has never had one and was not aware that she needed one. I informed her that everyone on injections get an epi pen since we are giving her what she is allergy to that she could have a reaction and need epi. RX sent in

## 2018-06-29 DIAGNOSIS — J189 Pneumonia, unspecified organism: Secondary | ICD-10-CM | POA: Diagnosis not present

## 2018-07-10 NOTE — Progress Notes (Signed)
Vials exp 07-11-19 

## 2018-07-11 DIAGNOSIS — J301 Allergic rhinitis due to pollen: Secondary | ICD-10-CM | POA: Diagnosis not present

## 2018-07-16 ENCOUNTER — Ambulatory Visit (INDEPENDENT_AMBULATORY_CARE_PROVIDER_SITE_OTHER): Payer: BLUE CROSS/BLUE SHIELD | Admitting: *Deleted

## 2018-07-16 DIAGNOSIS — J309 Allergic rhinitis, unspecified: Secondary | ICD-10-CM | POA: Diagnosis not present

## 2018-07-30 ENCOUNTER — Ambulatory Visit (INDEPENDENT_AMBULATORY_CARE_PROVIDER_SITE_OTHER): Payer: BLUE CROSS/BLUE SHIELD | Admitting: *Deleted

## 2018-07-30 DIAGNOSIS — J309 Allergic rhinitis, unspecified: Secondary | ICD-10-CM

## 2018-08-08 ENCOUNTER — Ambulatory Visit (INDEPENDENT_AMBULATORY_CARE_PROVIDER_SITE_OTHER): Payer: BLUE CROSS/BLUE SHIELD | Admitting: *Deleted

## 2018-08-08 DIAGNOSIS — J309 Allergic rhinitis, unspecified: Secondary | ICD-10-CM | POA: Diagnosis not present

## 2018-08-22 ENCOUNTER — Ambulatory Visit (INDEPENDENT_AMBULATORY_CARE_PROVIDER_SITE_OTHER): Payer: BLUE CROSS/BLUE SHIELD | Admitting: *Deleted

## 2018-08-22 DIAGNOSIS — J309 Allergic rhinitis, unspecified: Secondary | ICD-10-CM

## 2018-08-27 ENCOUNTER — Ambulatory Visit (INDEPENDENT_AMBULATORY_CARE_PROVIDER_SITE_OTHER): Payer: BLUE CROSS/BLUE SHIELD | Admitting: *Deleted

## 2018-08-27 DIAGNOSIS — J309 Allergic rhinitis, unspecified: Secondary | ICD-10-CM

## 2018-09-03 DIAGNOSIS — Z79899 Other long term (current) drug therapy: Secondary | ICD-10-CM | POA: Diagnosis not present

## 2018-09-03 DIAGNOSIS — F338 Other recurrent depressive disorders: Secondary | ICD-10-CM | POA: Diagnosis not present

## 2018-09-03 DIAGNOSIS — F900 Attention-deficit hyperactivity disorder, predominantly inattentive type: Secondary | ICD-10-CM | POA: Diagnosis not present

## 2018-09-03 DIAGNOSIS — F419 Anxiety disorder, unspecified: Secondary | ICD-10-CM | POA: Diagnosis not present

## 2018-09-08 DIAGNOSIS — J0140 Acute pansinusitis, unspecified: Secondary | ICD-10-CM | POA: Diagnosis not present

## 2018-09-08 DIAGNOSIS — Z23 Encounter for immunization: Secondary | ICD-10-CM | POA: Diagnosis not present

## 2018-09-18 ENCOUNTER — Other Ambulatory Visit: Payer: Self-pay | Admitting: Allergy and Immunology

## 2018-09-18 DIAGNOSIS — J3089 Other allergic rhinitis: Secondary | ICD-10-CM

## 2018-09-20 ENCOUNTER — Telehealth: Payer: Self-pay

## 2018-09-20 NOTE — Telephone Encounter (Signed)
Spoke to representative at [facility] for a prior authorization and he stated they will fax a form for us to sign and fax back.

## 2018-09-23 ENCOUNTER — Telehealth: Payer: Self-pay | Admitting: *Deleted

## 2018-09-23 NOTE — Telephone Encounter (Signed)
Ryvent PA done in covermymeds. Waiting response.

## 2018-09-24 NOTE — Telephone Encounter (Signed)
Pending as of 09-24-2018

## 2018-09-27 NOTE — Telephone Encounter (Signed)
PA is still showing as pending.

## 2018-09-30 NOTE — Telephone Encounter (Signed)
Still pending

## 2018-10-01 ENCOUNTER — Ambulatory Visit (INDEPENDENT_AMBULATORY_CARE_PROVIDER_SITE_OTHER): Payer: BLUE CROSS/BLUE SHIELD | Admitting: *Deleted

## 2018-10-01 DIAGNOSIS — J309 Allergic rhinitis, unspecified: Secondary | ICD-10-CM | POA: Diagnosis not present

## 2018-10-09 DIAGNOSIS — J069 Acute upper respiratory infection, unspecified: Secondary | ICD-10-CM | POA: Diagnosis not present

## 2018-10-19 DIAGNOSIS — N898 Other specified noninflammatory disorders of vagina: Secondary | ICD-10-CM | POA: Diagnosis not present

## 2018-11-16 IMAGING — MG DIGITAL SCREENING BILATERAL MAMMOGRAM WITH CAD
4 series · 4 of 4 positions shown · non-contrast
Comparison: Previous exam(s).

CLINICAL DATA: Screening.

EXAM:
DIGITAL SCREENING BILATERAL MAMMOGRAM WITH CAD

[R CC]
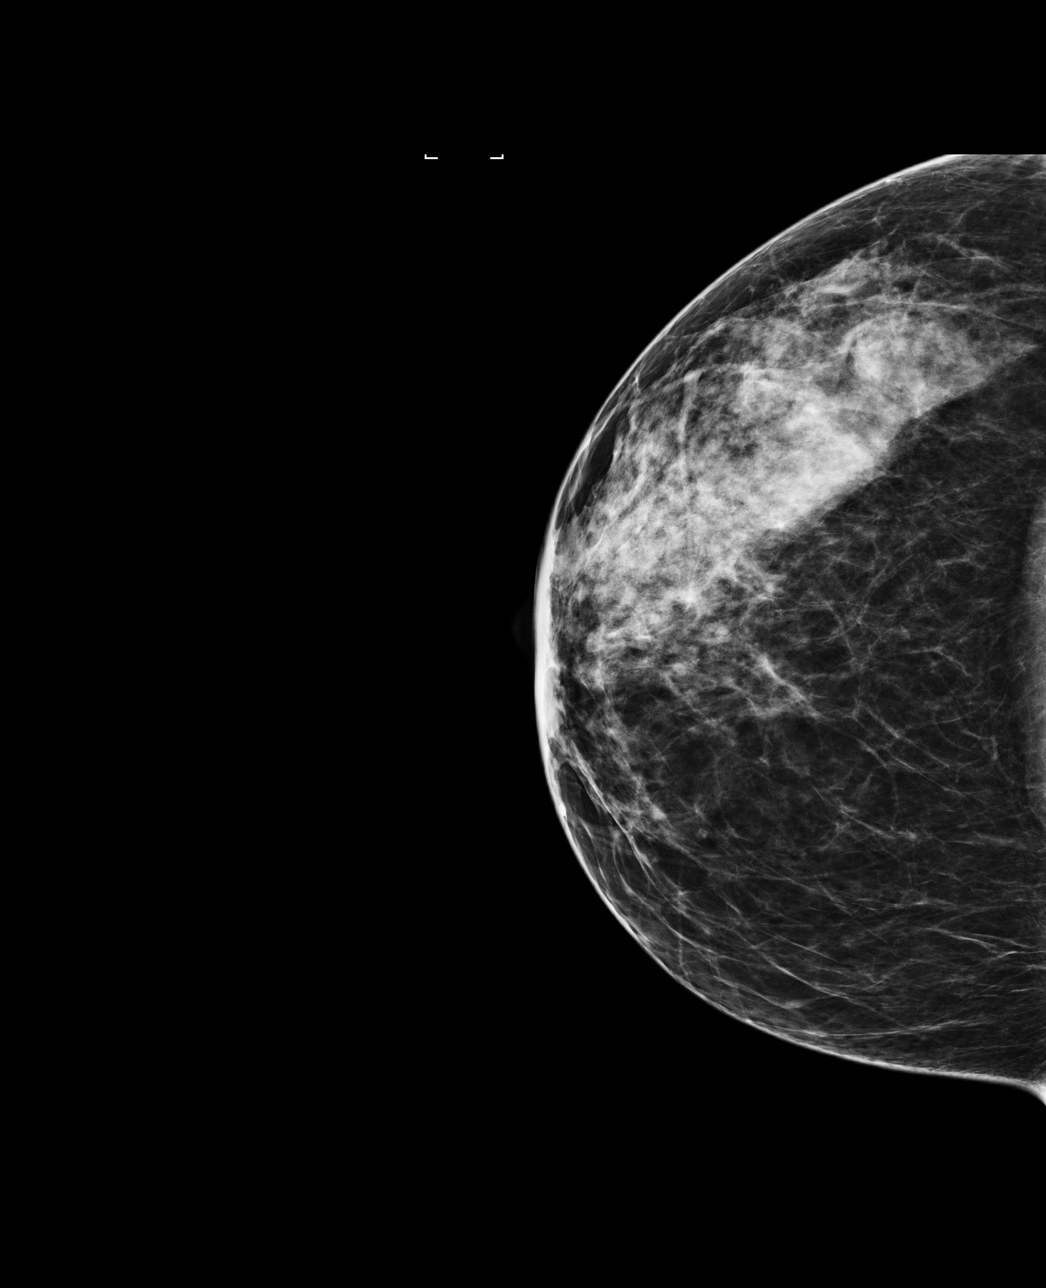

[L CC]
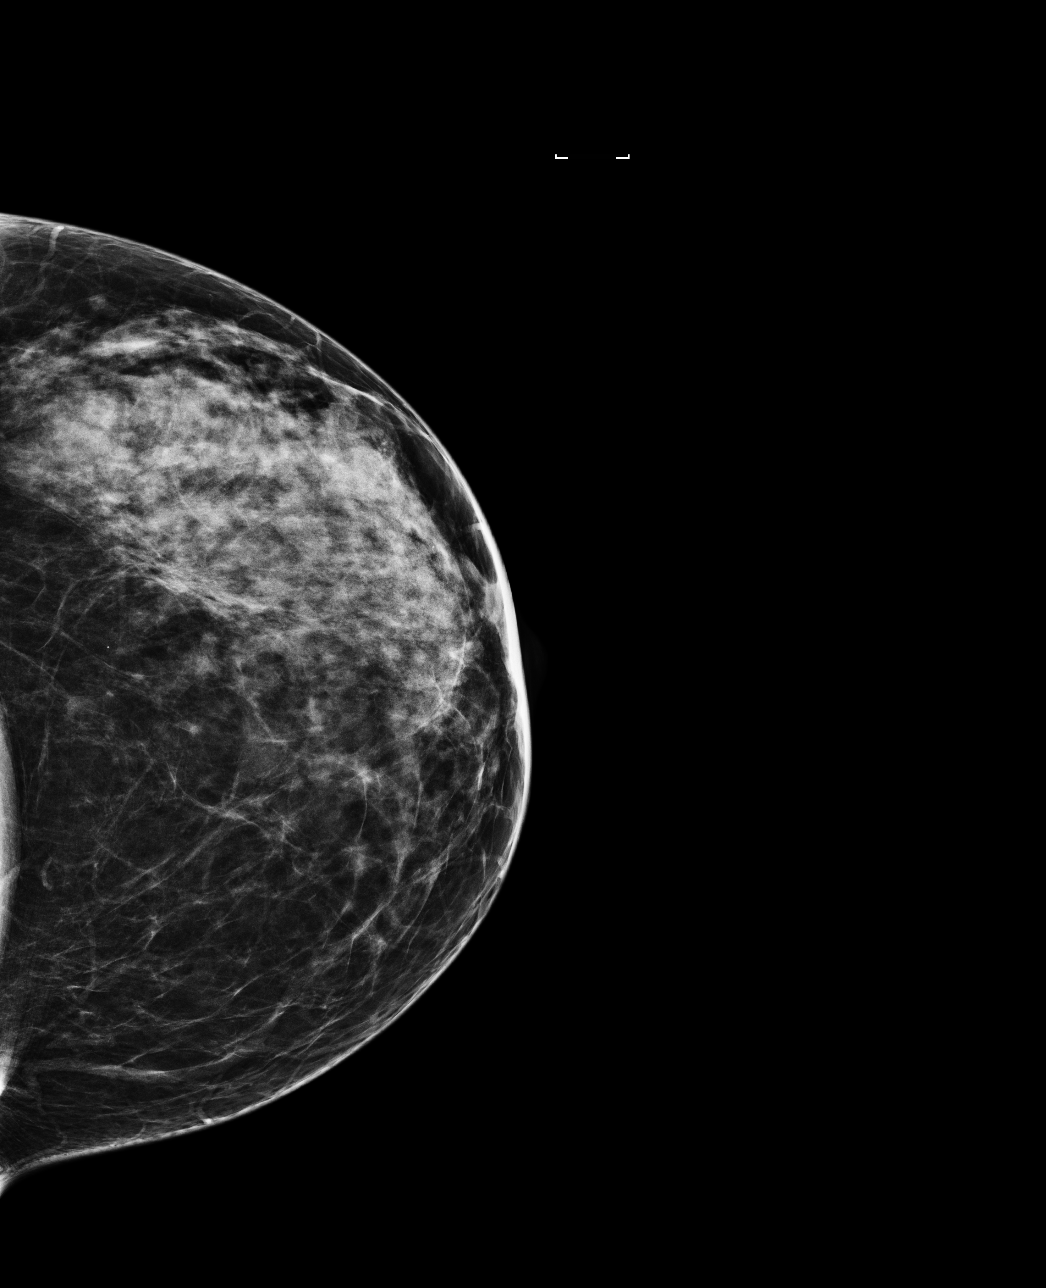

[R MLO]
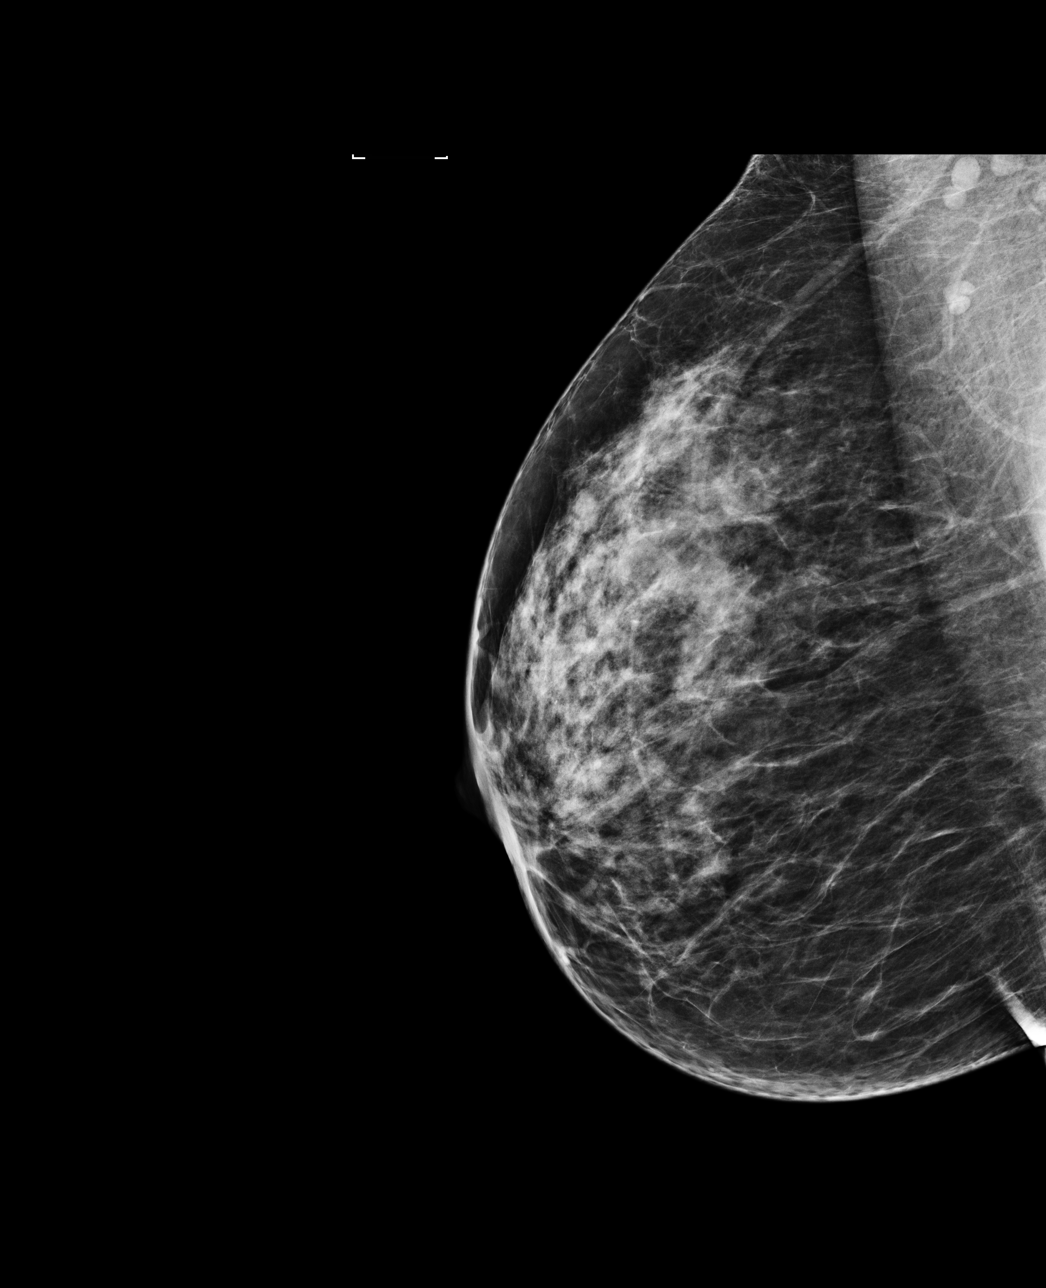

[L MLO]
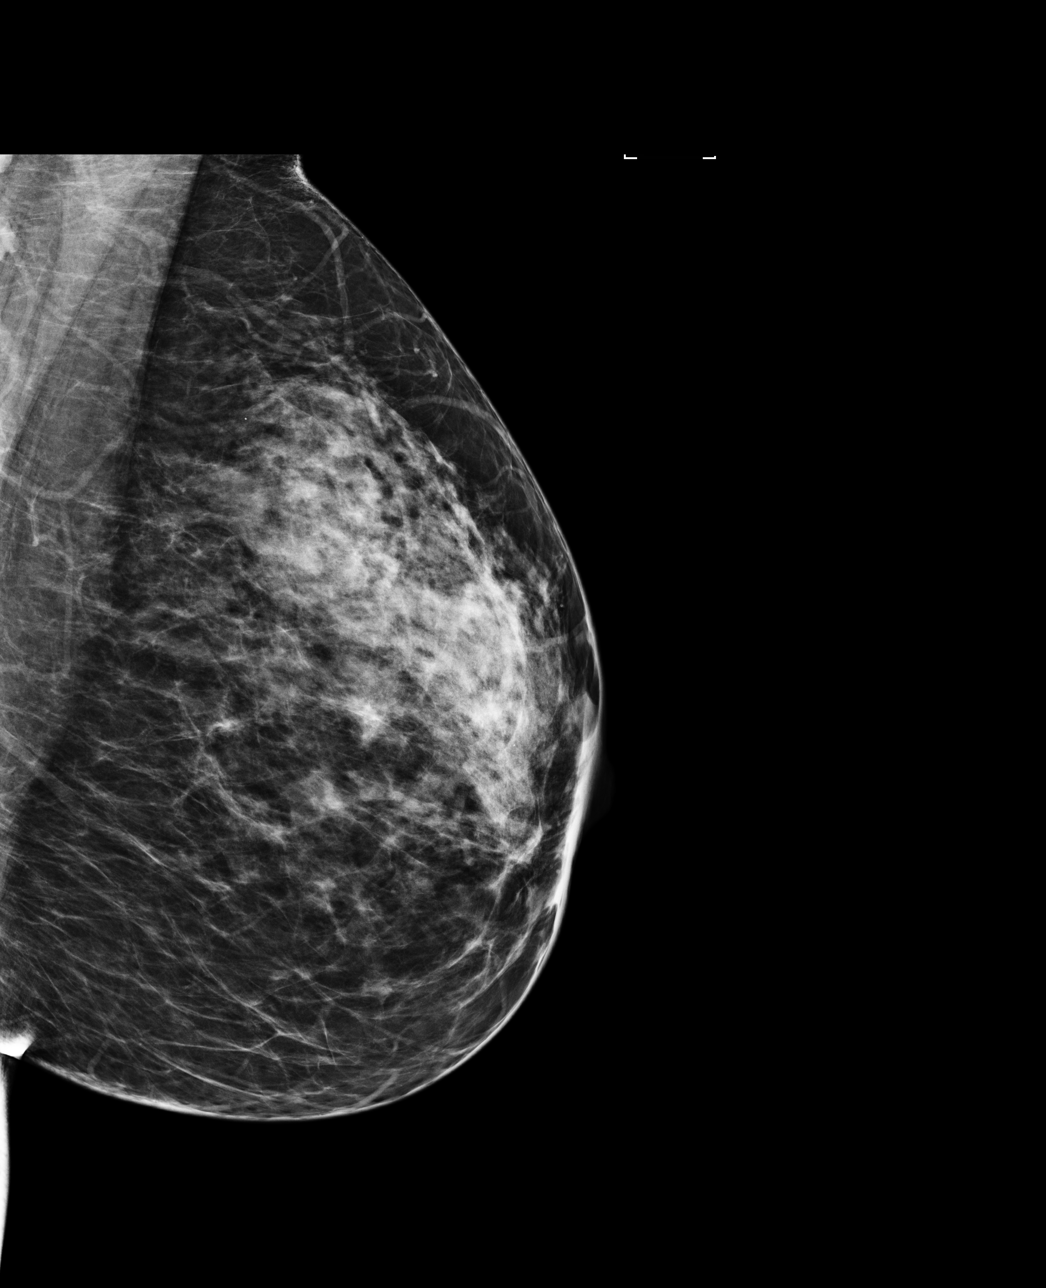

[4 of 4 positions shown; findings below may reference images not displayed]

ACR Breast Density Category c: The breast tissue is heterogeneously
dense, which may obscure small masses.
FINDINGS: There are no findings suspicious for malignancy. Images were
processed with CAD.
IMPRESSION: No mammographic evidence of malignancy. A result letter of this
screening mammogram will be mailed directly to the patient.

RECOMMENDATION:
Screening mammogram in one year. (Code:[id number])

BI-RADS CATEGORY  1: Negative.

## 2018-12-12 ENCOUNTER — Telehealth: Payer: Self-pay | Admitting: *Deleted

## 2018-12-12 NOTE — Telephone Encounter (Signed)
Patient called stating that they want to come in and restart their allergy injections. There has been a significant gap in between injections due to illness according to the patient. The last injection the patient received was .12mL out of her red vials on 10/01/18. Please advise dosage that should be given.

## 2018-12-12 NOTE — Telephone Encounter (Signed)
Please drop down to 0.1 of Green. Thanks.

## 2018-12-13 NOTE — Telephone Encounter (Signed)
Left message to inform patient she may come back in next week to restart shot. Labels printed to mix down vials.

## 2018-12-13 NOTE — Telephone Encounter (Signed)
Note placed on flowsheet.  

## 2018-12-27 ENCOUNTER — Other Ambulatory Visit: Payer: Self-pay | Admitting: *Deleted

## 2018-12-27 DIAGNOSIS — J3089 Other allergic rhinitis: Secondary | ICD-10-CM

## 2018-12-27 MED ORDER — FLUTICASONE PROPIONATE 93 MCG/ACT NA EXHU: 2.0000 | INHALANT_SUSPENSION | Freq: Two times a day (BID) | NASAL | 5 refills | Status: DC

## 2018-12-30 ENCOUNTER — Telehealth: Payer: Self-pay

## 2018-12-30 DIAGNOSIS — J3089 Other allergic rhinitis: Secondary | ICD-10-CM

## 2018-12-30 NOTE — Telephone Encounter (Signed)
[facility] is calling.  Needing 2 bottles of Xhance for a 30 day supply.  Told her that was fine.  Call ended.

## 2019-01-02 ENCOUNTER — Ambulatory Visit (INDEPENDENT_AMBULATORY_CARE_PROVIDER_SITE_OTHER): Payer: BLUE CROSS/BLUE SHIELD | Admitting: *Deleted

## 2019-01-02 DIAGNOSIS — J309 Allergic rhinitis, unspecified: Secondary | ICD-10-CM

## 2019-01-07 MED ORDER — FLUTICASONE PROPIONATE 93 MCG/ACT NA EXHU: 2.0000 | INHALANT_SUSPENSION | Freq: Two times a day (BID) | NASAL | 10 refills | Status: AC

## 2019-01-07 NOTE — Addendum Note (Signed)
Addended by: Mliss Fritz I on: 01/07/2019 09:29 AM   Modules accepted: Orders

## 2019-01-09 ENCOUNTER — Ambulatory Visit (INDEPENDENT_AMBULATORY_CARE_PROVIDER_SITE_OTHER): Payer: 59 | Admitting: *Deleted

## 2019-01-09 DIAGNOSIS — J309 Allergic rhinitis, unspecified: Secondary | ICD-10-CM

## 2019-01-13 ENCOUNTER — Other Ambulatory Visit: Payer: Self-pay

## 2019-01-13 ENCOUNTER — Ambulatory Visit (INDEPENDENT_AMBULATORY_CARE_PROVIDER_SITE_OTHER): Payer: 59 | Admitting: Allergy and Immunology

## 2019-01-13 ENCOUNTER — Encounter: Payer: Self-pay | Admitting: Allergy and Immunology

## 2019-01-13 DIAGNOSIS — J320 Chronic maxillary sinusitis: Secondary | ICD-10-CM

## 2019-01-13 DIAGNOSIS — H6983 Other specified disorders of Eustachian tube, bilateral: Secondary | ICD-10-CM

## 2019-01-13 DIAGNOSIS — H6993 Unspecified Eustachian tube disorder, bilateral: Secondary | ICD-10-CM

## 2019-01-13 DIAGNOSIS — J3089 Other allergic rhinitis: Secondary | ICD-10-CM

## 2019-01-13 DIAGNOSIS — J329 Chronic sinusitis, unspecified: Secondary | ICD-10-CM | POA: Insufficient documentation

## 2019-01-13 MED ORDER — CARBINOXAMINE MALEATE 6 MG PO TABS: 6.0000 mg | ORAL_TABLET | Freq: Four times a day (QID) | ORAL | 0 refills | Status: AC | PRN

## 2019-01-13 NOTE — Assessment & Plan Note (Signed)
At the first signs of a sinus infection please do the following:  Nasal saline lavage (NeilMed) as needed and prior to medicated nasal sprays.  Use Xhance, 2 actuations per nostril twice daily.  For thick post nasal drainage, nasal congestion, and/or sinus pressure, add guaifenesin 1200 mg (Mucinex Maximum Strength) plus/minus pseudoephedrine 120 mg  twice daily as needed with adequate hydration as discussed. Pseudoephedrine is only to be used for short-term relief of nasal/sinus congestion. Long-term use is discouraged due to potential side effects.

## 2019-01-13 NOTE — Assessment & Plan Note (Signed)
   Treatment plan as outlined above for allergic rhinitis.  Prednisone has been provided, 20 mg x 4 days, 10 mg x1 day, then stop.

## 2019-01-13 NOTE — Progress Notes (Signed)
Follow-up Telemedicine Note  RE: Karen Ortiz Date of Telemedicine Visit: 01/13/2019  Primary care provider: Lewis Moccasin, MD Referring provider: Lewis Moccasin, MD  Telemedicine Follow Up Visit via Telephone: I connected with Karen Ortiz for a follow up on 01/14/19 by telephone and verified that I am speaking with the correct person using two identifiers.   The limitations, risks, security and privacy concerns of performing an evaluation and management service by telemedicine, the availability of in person appointments, and that there may be a patient responsible charge related to this service were discussed. The patient expressed understanding and agreed to proceed.  Patient is at home.  Provider is at the office.  Visit start time: 3:30 pm Visit end time: 4:00 pm Insurance consent/check in by: Hilda Lias Medical consent and medical assistant/nurse: Herbert Seta  History of present illness: Karen Ortiz is a 43 y.o. female with allergic rhinitis, history of eustachian tube dysfunction, and history of food allergy presenting today for follow-up.  She was last seen in this clinic in March 2019.  She reports that her allergic rhinosinusitis symptoms have improved over the past 2 years while on aero allergen immunotherapy injections.  However, she reports that she had 3 sinus infections this past winter requiring antibiotics and oral steroids at the local urgent care.  In addition, she has recently been experiencing ear pressure, postnasal drainage, and throat irritation.  Her nasal congestion has improved significantly while on immunotherapy.  She reports that she had discontinued Xhance but recently restarted based upon the ear pressure and postnasal drainage.  In addition, she has not been taking carbinoxamine over the past several months because her insurance did not pay for RyVent.  Assessment and plan: Perennial and seasonal allergic  rhinitis  Continue appropriate allergen avoidance measures and immunotherapy as prescribed and as tolerated.  A prescription has been provided for RyVent (carbinoxamine maleate)  every 8 hours as needed.  Discontinue levocetirizine while taking RyVent.  Continue Xhance, 2 actuations per nostril twice a day.   Nasal saline spray (i.e., Simply Saline) or nasal saline lavage (i.e., NeilMed) is recommended as needed and prior to medicated nasal sprays.  Eustachian tube dysfunction  Treatment plan as outlined above for allergic rhinitis.  Prednisone has been provided, 20 mg x 4 days, 10 mg x1 day, then stop.  Recurrent sinusitis At the first signs of a sinus infection please do the following:  Nasal saline lavage (NeilMed) as needed and prior to medicated nasal sprays.  Use Xhance, 2 actuations per nostril twice daily.  For thick post nasal drainage, nasal congestion, and/or sinus pressure, add guaifenesin 1200 mg (Mucinex Maximum Strength) plus/minus pseudoephedrine 120 mg  twice daily as needed with adequate hydration as discussed. Pseudoephedrine is only to be used for short-term relief of nasal/sinus congestion. Long-term use is discouraged due to potential side effects.   Meds ordered this encounter  Medications  . Carbinoxamine Maleate (RYVENT) 6 MG TABS    Sig: Take 6 mg by mouth every 6 (six) hours as needed.    Dispense:  90 tablet    Refill:  0    Processor: [employer], BIN: [id number], RX PCN# [id number], Group #: [id number], Person Code: [id number], Cardholder ID# [id number]    Diagnostics: None.  Physical examination: Physical Exam Not obtained as encounter was done via telephone.   The following portions of the patient's history were reviewed and updated as appropriate: allergies, current medications, past family history, past medical history, past social  history, past surgical history and problem list.  Allergies as of 01/13/2019   No Known Allergies     Medication List        Accurate as of January 13, 2019 11:59 PM. Always use your most recent med list.        Carbinoxamine Maleate 6 MG Tabs Commonly known as:  RyVent Take 6 mg by mouth every 6 (six) hours as needed.   clobetasol cream 0.05 % Commonly known as:  TEMOVATE   Fluticasone Propionate 93 MCG/ACT Exhu Commonly known as:  Xhance Place 2 sprays into the nose 2 (two) times daily.   ibuprofen 600 MG tablet Commonly known as:  ADVIL,MOTRIN Take 1 tablet (600 mg total) by mouth every 6 (six) hours.   ketoconazole 2 % shampoo Commonly known as:  NIZORAL Apply 2 application topically as needed.   levocetirizine 5 MG tablet Commonly known as:  XYZAL Take 1 tablet (5 mg total) by mouth as needed for allergies.   Reclipsen 0.15-30 MG-MCG tablet Generic drug:  desogestrel-ethinyl estradiol   VITAMIN D PO Take 5,000 Units by mouth.   ZOLOFT PO Take by mouth.       No Known Allergies  Review of systems: Review of systems negative except as noted in HPI / PMHx or noted below: Constitutional: Negative.  HENT: Negative.   Eyes: Negative.  Respiratory: Negative.   Cardiovascular: Negative.  Gastrointestinal: Negative.  Genitourinary: Negative.  Musculoskeletal: Negative.  Neurological: Negative.  Endo/Heme/Allergies: Negative.  Cutaneous: Negative.  Past Medical History:  Diagnosis Date  . Gestational diabetes   . Hx of varicella     Family History  Problem Relation Age of Onset  . Heart murmur Mother   . Heart disease Mother   . Migraines Mother   . Alcohol abuse Father   . Dementia Maternal Grandmother   . Heart disease Maternal Grandfather   . Heart attack Maternal Grandfather   . Cancer Paternal Grandmother        skin and leukemia    Social History   Socioeconomic History  . Marital status: Married    Spouse name: Not on file  . Number of children: Not on file  . Years of education: Not on file  . Highest education level: Not on file  Occupational History   . Not on file  Social Needs  . Financial resource strain: Not on file  . Food insecurity:    Worry: Not on file    Inability: Not on file  . Transportation needs:    Medical: Not on file    Non-medical: Not on file  Tobacco Use  . Smoking status: Never Smoker  . Smokeless tobacco: Never Used  Substance and Sexual Activity  . Alcohol use: No  . Drug use: No  . Sexual activity: Yes    Partners: Male    Birth control/protection: Pill  Lifestyle  . Physical activity:    Days per week: Not on file    Minutes per session: Not on file  . Stress: Not on file  Relationships  . Social connections:    Talks on phone: Not on file    Gets together: Not on file    Attends religious service: Not on file    Active member of club or organization: Not on file    Attends meetings of clubs or organizations: Not on file    Relationship status: Not on file  . Intimate partner violence:    Fear of current or ex  partner: Not on file    Emotionally abused: Not on file    Physically abused: Not on file    Forced sexual activity: Not on file  Other Topics Concern  . Not on file  Social History Narrative  . Not on file    Previous notes and tests were reviewed.  I discussed the assessment and treatment plan with the patient. The patient was provided an opportunity to ask questions and all were answered. The patient agreed with the plan and demonstrated an understanding of the instructions.   The patient was advised to call back or seek an in-person evaluation if the symptoms worsen or if the condition fails to improve as anticipated.  I provided 30 minutes of non-face-to-face time during this encounter.  I appreciate the opportunity to take part in Gradie's care. Please do not hesitate to contact me with questions.  Sincerely,   R. Jorene Guest, MD

## 2019-01-13 NOTE — Assessment & Plan Note (Signed)
   Continue appropriate allergen avoidance measures and immunotherapy as prescribed and as tolerated.  A prescription has been provided for RyVent (carbinoxamine maleate) 6mg  every 8 hours as needed.  Discontinue levocetirizine while taking RyVent.  Continue Xhance, 2 actuations per nostril twice a day.   Nasal saline spray (i.e., Simply Saline) or nasal saline lavage (i.e., NeilMed) is recommended as needed and prior to medicated nasal sprays.

## 2019-01-13 NOTE — Progress Notes (Signed)
Start time:  15:30 Finish Time:  16:00 Where are you located:  Patient at home.  Do you give [place] permission to bill your insurance:  Yes Are you signed up for my chart:  Active.   Starting to have allergy problems. Better than prior to allergy shots. Would like to try Ryvent again with new insurance.

## 2019-01-13 NOTE — Patient Instructions (Addendum)
Perennial and seasonal allergic rhinitis  Continue appropriate allergen avoidance measures and immunotherapy as prescribed and as tolerated.  A prescription has been provided for RyVent (carbinoxamine maleate) 6mg  every 8 hours as needed.  Discontinue levocetirizine while taking RyVent.  Continue Xhance, 2 actuations per nostril twice a day.   Nasal saline spray (i.e., Simply Saline) or nasal saline lavage (i.e., NeilMed) is recommended as needed and prior to medicated nasal sprays.  Eustachian tube dysfunction  Treatment plan as outlined above for allergic rhinitis.  Prednisone has been provided, 20 mg x 4 days, 10 mg x1 day, then stop.  Recurrent sinusitis At the first signs of a sinus infection please do the following:  Nasal saline lavage (NeilMed) as needed and prior to medicated nasal sprays.  Use Xhance, 2 actuations per nostril twice daily.  For thick post nasal drainage, nasal congestion, and/or sinus pressure, add guaifenesin 1200 mg (Mucinex Maximum Strength) plus/minus pseudoephedrine 120 mg  twice daily as needed with adequate hydration as discussed. Pseudoephedrine is only to be used for short-term relief of nasal/sinus congestion. Long-term use is discouraged due to potential side effects.   Return in about 6 months (around 07/15/2019), or if symptoms worsen or fail to improve.

## 2019-01-16 ENCOUNTER — Telehealth: Payer: Self-pay

## 2019-01-16 ENCOUNTER — Ambulatory Visit (INDEPENDENT_AMBULATORY_CARE_PROVIDER_SITE_OTHER): Payer: 59 | Admitting: *Deleted

## 2019-01-16 DIAGNOSIS — J309 Allergic rhinitis, unspecified: Secondary | ICD-10-CM

## 2019-01-16 NOTE — Telephone Encounter (Signed)
Yes, Ok to switch. Carbinoxamine 4 mg every 6-8 hours as needed. Thanks.

## 2019-01-16 NOTE — Telephone Encounter (Signed)
Notified Pharmacy of the switch.

## 2019-01-16 NOTE — Telephone Encounter (Signed)
Pharmacy called and stated that Ryvent 6mg  tablets are not covered by insurance. Carbinoxamine 4 mg tablets are cover, is it ok to switch. Please advise

## 2019-01-23 ENCOUNTER — Ambulatory Visit (INDEPENDENT_AMBULATORY_CARE_PROVIDER_SITE_OTHER): Payer: 59 | Admitting: *Deleted

## 2019-01-23 DIAGNOSIS — J309 Allergic rhinitis, unspecified: Secondary | ICD-10-CM

## 2019-02-05 ENCOUNTER — Ambulatory Visit (INDEPENDENT_AMBULATORY_CARE_PROVIDER_SITE_OTHER): Payer: 59 | Admitting: *Deleted

## 2019-02-05 DIAGNOSIS — J309 Allergic rhinitis, unspecified: Secondary | ICD-10-CM

## 2019-02-20 ENCOUNTER — Ambulatory Visit (INDEPENDENT_AMBULATORY_CARE_PROVIDER_SITE_OTHER): Payer: 59 | Admitting: *Deleted

## 2019-02-20 DIAGNOSIS — J309 Allergic rhinitis, unspecified: Secondary | ICD-10-CM

## 2019-03-10 ENCOUNTER — Ambulatory Visit (INDEPENDENT_AMBULATORY_CARE_PROVIDER_SITE_OTHER): Payer: 59

## 2019-03-10 DIAGNOSIS — J309 Allergic rhinitis, unspecified: Secondary | ICD-10-CM

## 2019-03-19 NOTE — Progress Notes (Signed)
VIALS NOT MADE ON THIS DATE.  JM 

## 2019-03-20 ENCOUNTER — Ambulatory Visit (INDEPENDENT_AMBULATORY_CARE_PROVIDER_SITE_OTHER): Payer: 59

## 2019-03-20 DIAGNOSIS — J309 Allergic rhinitis, unspecified: Secondary | ICD-10-CM

## 2019-06-17 DIAGNOSIS — J301 Allergic rhinitis due to pollen: Secondary | ICD-10-CM

## 2019-06-17 NOTE — Progress Notes (Signed)
VIALS EXP 06-16-20 

## 2019-07-10 ENCOUNTER — Telehealth: Payer: Self-pay | Admitting: *Deleted

## 2019-07-10 ENCOUNTER — Ambulatory Visit: Payer: Self-pay | Admitting: *Deleted

## 2019-07-10 NOTE — Telephone Encounter (Signed)
Patient last received .30 out of her red vials on 03/20/2019. These vials expire tomorrow and new red vials have been made. Please advise where you would like for the patient to start back on injetions?

## 2019-07-10 NOTE — Telephone Encounter (Signed)
Has she had any problems with large local rxn or systemic reactions?

## 2019-07-11 NOTE — Telephone Encounter (Signed)
Looking back at all of the encounters from this year there has been no mention of any reactions of any kind.

## 2019-07-14 NOTE — Telephone Encounter (Signed)
Vials have been mixed down to Gold. Called patient and informed. Patient verbalized understanding.

## 2019-07-14 NOTE — Telephone Encounter (Signed)
Gold 0.05cc, schedule B. Thanks.

## 2019-07-21 ENCOUNTER — Ambulatory Visit: Payer: 59 | Admitting: Allergy and Immunology

## 2019-07-24 ENCOUNTER — Ambulatory Visit (INDEPENDENT_AMBULATORY_CARE_PROVIDER_SITE_OTHER): Payer: BC Managed Care – PPO | Admitting: *Deleted

## 2019-07-24 DIAGNOSIS — J309 Allergic rhinitis, unspecified: Secondary | ICD-10-CM

## 2019-07-29 ENCOUNTER — Other Ambulatory Visit: Payer: Self-pay | Admitting: Family Medicine

## 2019-07-29 ENCOUNTER — Ambulatory Visit (INDEPENDENT_AMBULATORY_CARE_PROVIDER_SITE_OTHER): Payer: BC Managed Care – PPO

## 2019-07-29 DIAGNOSIS — J309 Allergic rhinitis, unspecified: Secondary | ICD-10-CM | POA: Diagnosis not present

## 2019-07-29 DIAGNOSIS — Z1231 Encounter for screening mammogram for malignant neoplasm of breast: Secondary | ICD-10-CM

## 2019-08-07 ENCOUNTER — Ambulatory Visit (INDEPENDENT_AMBULATORY_CARE_PROVIDER_SITE_OTHER): Payer: BC Managed Care – PPO | Admitting: *Deleted

## 2019-08-07 DIAGNOSIS — J309 Allergic rhinitis, unspecified: Secondary | ICD-10-CM

## 2019-08-11 ENCOUNTER — Encounter: Payer: Self-pay | Admitting: Allergy and Immunology

## 2019-08-11 ENCOUNTER — Ambulatory Visit (INDEPENDENT_AMBULATORY_CARE_PROVIDER_SITE_OTHER): Payer: BC Managed Care – PPO | Admitting: Allergy and Immunology

## 2019-08-11 ENCOUNTER — Other Ambulatory Visit: Payer: Self-pay

## 2019-08-11 DIAGNOSIS — H6983 Other specified disorders of Eustachian tube, bilateral: Secondary | ICD-10-CM

## 2019-08-11 DIAGNOSIS — J3089 Other allergic rhinitis: Secondary | ICD-10-CM | POA: Diagnosis not present

## 2019-08-11 MED ORDER — CARBINOXAMINE MALEATE 4 MG PO TABS: 4.0000 mg | ORAL_TABLET | Freq: Two times a day (BID) | ORAL | 2 refills | Status: AC

## 2019-08-11 MED ORDER — XHANCE 93 MCG/ACT NA EXHU: 2.0000 | INHALANT_SUSPENSION | Freq: Two times a day (BID) | NASAL | 3 refills | Status: AC

## 2019-08-11 NOTE — Progress Notes (Signed)
Follow-up Telemedicine Note  RE: Karen Ortiz MRN: [id number] DOB: 1975-12-27 Date of Telemedicine Visit: 08/11/2019  Primary care provider: Lewis Moccasin, MD Referring provider: Lewis Moccasin, MD  Telemedicine Follow Up Visit via Telephone: I connected with Karen Ortiz for a follow up on 08/11/19 by telephone and verified that I am speaking with the correct person using two identifiers.   The limitations, risks, security and privacy concerns of performing an evaluation and management service by telemedicine, the availability of in person appointments, and that there may be a patient responsible charge related to this service were discussed. The patient expressed understanding and agreed to proceed.  Patient is at home.  Provider is at the office.  Visit start time: 4:15 PM Visit end time: 4:30 PM Insurance consent/check in by: [facility] consent and medical assistant/nurse: Darreld Mclean  History of present illness: Karen Ortiz is a 43 y.o. female with allergic rhinitis and eustachian tube dysfunction.  She reports that over the past 2 to 3 weeks she has experienced some increased postnasal drainage, irritated throat, and rhinorrhea.  In addition, she has been experiencing bilateral ear congestion/pressure.  She takes carbinoxamine as needed and uses nasal saline rinse sporadically.  She is currently not using Xhance or any other medicated nasal spray because of a problem with her insurance.  She has changed insurance in the interval since her previous visit.   Assessment and plan: Perennial and seasonal allergic rhinitis  Continue appropriate allergen avoidance measures and immunotherapy as prescribed and as tolerated.  A refill prescription has been provided for carbinoxamine maleate 4mg  every 6-8 hours as needed.  A prescription has been provided for [facility], 2 actuations per nostril twice a day.   Nasal saline lavage (NeilMed) has been recommended as needed  and prior to medicated nasal sprays along with instructions for proper administration.  For thick post nasal drainage, add guaifenesin 1200 mg (Mucinex Maximum Strength)  twice daily as needed with adequate hydration as discussed.  Eustachian tube dysfunction  Treatment plan as outlined above for allergic rhinitis.  If this problem persists or progresses despite treatment plan as outlined above, consider evaluation by otolaryngology.   Meds ordered this encounter  Medications  . Fluticasone Propionate (XHANCE) 93 MCG/ACT EXHU    Sig: Place 2 sprays into the nose 2 (two) times daily.    Dispense:  32 mL    Refill:  3  . Carbinoxamine Maleate 4 MG TABS    Sig: Take 1 tablet (4 mg total) by mouth 2 (two) times daily.    Dispense:  180 tablet    Refill:  2    Diagnostics: None.   Physical examination: Physical Exam Not obtained as encounter was done via telephone.   The following portions of the patient's history were reviewed and updated as appropriate: allergies, current medications, past family history, past medical history, past social history, past surgical history and problem list.  Allergies as of 08/11/2019   No Known Allergies     Medication List       Accurate as of August 11, 2019 11:06 PM. If you have any questions, ask your nurse or doctor.        STOP taking these medications   ketoconazole 2 % shampoo Commonly known as: NIZORAL Stopped by: August 13, 2019, MD   levocetirizine 5 MG tablet Commonly known as: XYZAL Stopped by: Wellington Hampshire, MD   Reclipsen 0.15-30 MG-MCG tablet Generic drug: desogestrel-ethinyl estradiol Stopped by: 02-08-2004,  MD   ZOLOFT PO Stopped by: Edmonia Lynch, MD     TAKE these medications   amphetamine-dextroamphetamine 10 MG 24 hr capsule Commonly known as: ADDERALL XR Take 10 mg by mouth daily.   Carbinoxamine Maleate 6 MG Tabs Commonly known as: RyVent Take 6 mg by mouth every 6 (six) hours as  needed. What changed: Another medication with the same name was added. Make sure you understand how and when to take each. Changed by: Edmonia Lynch, MD   Carbinoxamine Maleate 4 MG Tabs Take 1 tablet (4 mg total) by mouth 2 (two) times daily. What changed: You were already taking a medication with the same name, and this prescription was added. Make sure you understand how and when to take each. Changed by: Edmonia Lynch, MD   clobetasol cream 0.05 % Commonly known as: TEMOVATE   Fluticasone Propionate 93 MCG/ACT Exhu Commonly known as: Xhance Place 2 sprays into the nose 2 (two) times daily. What changed: Another medication with the same name was added. Make sure you understand how and when to take each. Changed by: Edmonia Lynch, MD   Cyril Loosen MCG/ACT Exhu Generic drug: Fluticasone Propionate Place 2 sprays into the nose 2 (two) times daily. What changed: You were already taking a medication with the same name, and this prescription was added. Make sure you understand how and when to take each. Changed by: Edmonia Lynch, MD   ibuprofen 600 MG tablet Commonly known as: ADVIL Take 1 tablet (600 mg total) by mouth every 6 (six) hours.   lisinopril 20 MG tablet Commonly known as: ZESTRIL Take 20 mg by mouth daily.   norethindrone 0.35 MG tablet Commonly known as: MICRONOR Take 1 tablet by mouth daily.   VITAMIN D PO Take 5,000 Units by mouth.       No Known Allergies  Previous notes and tests were reviewed.  I discussed the assessment and treatment plan with the patient. The patient was provided an opportunity to ask questions and all were answered. The patient agreed with the plan and demonstrated an understanding of the instructions.   The patient was advised to call back or seek an in-person evaluation if the symptoms worsen or if the condition fails to improve as anticipated.  I provided 15 minutes of non-face-to-face time during this encounter.  I  appreciate the opportunity to take part in Karen Ortiz's care. Please do not hesitate to contact me with questions.  Sincerely,    R. Edgar Frisk, MD

## 2019-08-11 NOTE — Assessment & Plan Note (Signed)
   Treatment plan as outlined above for allergic rhinitis.  If this problem persists or progresses despite treatment plan as outlined above, consider evaluation by otolaryngology.

## 2019-08-11 NOTE — Patient Instructions (Addendum)
Perennial and seasonal allergic rhinitis  Continue appropriate allergen avoidance measures and immunotherapy as prescribed and as tolerated.  A refill prescription has been provided for carbinoxamine maleate 4mg  every 6-8 hours as needed.  A prescription has been provided for [facility], 2 actuations per nostril twice a day.   Nasal saline lavage (NeilMed) has been recommended as needed and prior to medicated nasal sprays along with instructions for proper administration.  For thick post nasal drainage, add guaifenesin 1200 mg (Mucinex Maximum Strength)  twice daily as needed with adequate hydration as discussed.  Eustachian tube dysfunction  Treatment plan as outlined above for allergic rhinitis.  If this problem persists or progresses despite treatment plan as outlined above, consider evaluation by otolaryngology.   Return in about 6 months (around 02/08/2020), or if symptoms worsen or fail to improve.

## 2019-08-11 NOTE — Progress Notes (Deleted)
Follow-up Note  RE: Analycia Khokhar MRN: [id number] DOB: 1976/08/31 Date of Office Visit: 08/11/2019  Primary care provider: Fanny Bien, MD Referring provider: Fanny Bien, MD  History of present illness:   Over the past 2-3 weeks, increased sx - pnd, sore thorat, runny nose. Some ear congestion.   Just started back on itx a couple weeks ago wiythout problemss  carbinoxaine and occ sinus rinse  Not using xhance bc of previousl glitchiin inusrce.   Refill carbox 4 mg  Call xhance.   Assessment and plan: Perennial and seasonal allergic rhinitis  Continue appropriate allergen avoidance measures and immunotherapy as prescribed and as tolerated.  A refill prescription has been provided for carbinoxamine maleate 4mg  every 6-8 hours as needed.  A prescription has been provided for [facility], 2 actuations per nostril twice a day.   Nasal saline lavage (NeilMed) has been recommended as needed and prior to medicated nasal sprays along with instructions for proper administration.  For thick post nasal drainage, add guaifenesin 1200 mg (Mucinex Maximum Strength)  twice daily as needed with adequate hydration as discussed.  Eustachian tube dysfunction  Treatment plan as outlined above for allergic rhinitis.  If this problem persists or progresses despite treatment plan as outlined above, consider evaluation by otolaryngology.   Meds ordered this encounter  Medications  . Fluticasone Propionate (XHANCE) 93 MCG/ACT EXHU    Sig: Place 2 sprays into the nose 2 (two) times daily.    Dispense:  32 mL    Refill:  3  . Carbinoxamine Maleate 4 MG TABS    Sig: Take 1 tablet (4 mg total) by mouth 2 (two) times daily.    Dispense:  180 tablet    Refill:  2    Diagnostics: ***    Physical examination: unknown if currently breastfeeding.  General: Alert, interactive, in no acute distress. HEENT: TMs pearly gray, turbinates {Blank  single:19197::"non-edematous","edematous","edematous and pale","markedly edematous","markedly edematous and pale","moderately edematous","mildly edematous","minimally edematous"} {Blank single:19197::"with crusty discharge","with thick discharge","with clear discharge","without discharge"}, post-pharynx {Blank single:19197::"non erythematous","erythematous","markedly erythematous","moderately erythematous","mildly erythematous","unremarkable"}. Neck: Supple without lymphadenopathy. Lungs: {Blank single:19197::"Upper airway sounds transmitted","Bronchial sounds auscultated bilaterally","Decreased breath sounds with expiratory wheezing bilaterally","Mildly decreased breath sounds with expiratory wheezing bilaterally","Decreased breath sounds bilaterally without wheezing, rhonchi or rales","Mildly decreased breath sounds bilaterally without wheezing, rhonchi or rales","Clear to auscultation without wheezing, rhonchi or rales"}. CV: Normal S1, S2 without murmurs. Skin: {Blank single:19197::"Dry, erythematous, excoriated patches on the ***","Dry, hyperpigmented, thickened patches on the ***","Dry, mildly hyperpigmented, mildly thickened patches on the ***","Scattered erythematous urticarial type lesions primarily located *** , nonvesicular","Warm and dry, without lesions or rashes"}.  {Common ambulatory SmartLinks:19316::" "}  Current Outpatient Medications  Medication Sig Dispense Refill  . amphetamine-dextroamphetamine (ADDERALL XR) 10 MG 24 hr capsule Take 10 mg by mouth daily.    . Carbinoxamine Maleate (RYVENT) 6 MG TABS Take 6 mg by mouth every 6 (six) hours as needed. 90 tablet 0  . Cholecalciferol (VITAMIN D PO) Take 5,000 Units by mouth.    . clobetasol cream (TEMOVATE) 0.05 %     . ibuprofen (ADVIL,MOTRIN) 600 MG tablet Take 1 tablet (600 mg total) by mouth every 6 (six) hours. 30 tablet 0  . lisinopril (ZESTRIL) 20 MG tablet Take 20 mg by mouth daily.    . norethindrone (MICRONOR) 0.35 MG  tablet Take 1 tablet by mouth daily.    . Carbinoxamine Maleate 4 MG TABS Take 1 tablet (4 mg total) by mouth 2 (two) times daily. 180 tablet  2  . Fluticasone Propionate (XHANCE) 93 MCG/ACT EXHU Place 2 sprays into the nose 2 (two) times daily. (Patient not taking: Reported on 08/11/2019) 32 mL 10  . Fluticasone Propionate (XHANCE) 93 MCG/ACT EXHU Place 2 sprays into the nose 2 (two) times daily. 32 mL 3   No current facility-administered medications for this visit.     No Known Allergies  I appreciate the opportunity to take part in Aryahna's care. Please do not hesitate to contact me with questions.  Sincerely,   R. Jorene Guest, MD

## 2019-08-11 NOTE — Assessment & Plan Note (Signed)
   Continue appropriate allergen avoidance measures and immunotherapy as prescribed and as tolerated.  A refill prescription has been provided for carbinoxamine maleate 4mg  every 6-8 hours as needed.  A prescription has been provided for [facility], 2 actuations per nostril twice a day.   Nasal saline lavage (NeilMed) has been recommended as needed and prior to medicated nasal sprays along with instructions for proper administration.  For thick post nasal drainage, add guaifenesin 1200 mg (Mucinex Maximum Strength)  twice daily as needed with adequate hydration as discussed.

## 2019-08-14 ENCOUNTER — Ambulatory Visit (INDEPENDENT_AMBULATORY_CARE_PROVIDER_SITE_OTHER): Payer: BC Managed Care – PPO | Admitting: *Deleted

## 2019-08-14 DIAGNOSIS — J309 Allergic rhinitis, unspecified: Secondary | ICD-10-CM

## 2019-09-02 ENCOUNTER — Ambulatory Visit (INDEPENDENT_AMBULATORY_CARE_PROVIDER_SITE_OTHER): Payer: BC Managed Care – PPO | Admitting: *Deleted

## 2019-09-02 DIAGNOSIS — J309 Allergic rhinitis, unspecified: Secondary | ICD-10-CM | POA: Diagnosis not present

## 2019-09-17 ENCOUNTER — Other Ambulatory Visit: Payer: Self-pay

## 2019-09-17 ENCOUNTER — Ambulatory Visit
Admission: RE | Admit: 2019-09-17 | Discharge: 2019-09-17 | Disposition: A | Discharge status: Home / Self Care | Payer: BC Managed Care – PPO | Source: Ambulatory Visit | Attending: Family Medicine | Admitting: Family Medicine

## 2019-09-17 DIAGNOSIS — Z1231 Encounter for screening mammogram for malignant neoplasm of breast: Secondary | ICD-10-CM

## 2019-09-18 ENCOUNTER — Other Ambulatory Visit: Payer: Self-pay | Admitting: Family Medicine

## 2019-09-18 DIAGNOSIS — R928 Other abnormal and inconclusive findings on diagnostic imaging of breast: Secondary | ICD-10-CM

## 2019-09-29 ENCOUNTER — Ambulatory Visit: Payer: BC Managed Care – PPO | Attending: Internal Medicine

## 2019-09-29 DIAGNOSIS — Z20822 Contact with and (suspected) exposure to covid-19: Secondary | ICD-10-CM

## 2019-09-30 LAB — NOVEL CORONAVIRUS, NAA: SARS-CoV-2, NAA: NOT DETECTED

## 2019-10-01 ENCOUNTER — Other Ambulatory Visit: Payer: Self-pay

## 2019-10-01 ENCOUNTER — Ambulatory Visit
Admission: RE | Admit: 2019-10-01 | Discharge: 2019-10-01 | Disposition: A | Discharge status: Home / Self Care | Payer: BC Managed Care – PPO | Source: Ambulatory Visit | Attending: Family Medicine | Admitting: Family Medicine

## 2019-10-01 ENCOUNTER — Ambulatory Visit: Payer: BC Managed Care – PPO

## 2019-10-01 DIAGNOSIS — R928 Other abnormal and inconclusive findings on diagnostic imaging of breast: Secondary | ICD-10-CM

## 2019-10-28 ENCOUNTER — Ambulatory Visit (INDEPENDENT_AMBULATORY_CARE_PROVIDER_SITE_OTHER): Payer: BC Managed Care – PPO

## 2019-10-28 ENCOUNTER — Ambulatory Visit: Payer: Self-pay | Admitting: *Deleted

## 2019-10-28 DIAGNOSIS — J309 Allergic rhinitis, unspecified: Secondary | ICD-10-CM

## 2019-11-04 ENCOUNTER — Ambulatory Visit (INDEPENDENT_AMBULATORY_CARE_PROVIDER_SITE_OTHER): Payer: BC Managed Care – PPO

## 2019-11-04 DIAGNOSIS — J309 Allergic rhinitis, unspecified: Secondary | ICD-10-CM | POA: Diagnosis not present

## 2019-11-11 ENCOUNTER — Ambulatory Visit (INDEPENDENT_AMBULATORY_CARE_PROVIDER_SITE_OTHER): Payer: BC Managed Care – PPO

## 2019-11-11 DIAGNOSIS — J309 Allergic rhinitis, unspecified: Secondary | ICD-10-CM | POA: Diagnosis not present

## 2019-11-18 ENCOUNTER — Ambulatory Visit (INDEPENDENT_AMBULATORY_CARE_PROVIDER_SITE_OTHER): Payer: BC Managed Care – PPO

## 2019-11-18 DIAGNOSIS — J309 Allergic rhinitis, unspecified: Secondary | ICD-10-CM

## 2019-11-27 ENCOUNTER — Ambulatory Visit: Payer: BC Managed Care – PPO | Attending: Internal Medicine

## 2019-11-27 DIAGNOSIS — Z23 Encounter for immunization: Secondary | ICD-10-CM | POA: Insufficient documentation

## 2019-11-27 NOTE — Progress Notes (Signed)
   Covid-19 Vaccination Clinic  Name:  Karen Ortiz    MRN: [id number] DOB: [birth year]  11/27/2019  Karen Ortiz was observed post Covid-19 immunization for 15 minutes without incidence. She was provided with Vaccine Information Sheet and instruction to access the V-Safe system.   Karen Ortiz was instructed to call 911 with any severe reactions post vaccine: Marland Kitchen Difficulty breathing  . Swelling of your face and throat  . A fast heartbeat  . A bad rash all over your body  . Dizziness and weakness    Immunizations Administered    Name Date Dose VIS Date Route   Pfizer COVID-19 Vaccine 11/27/2019  8:55 AM 0.3 mL 09/12/2019 Intramuscular   Manufacturer: [employer], [employer]   Lot: [id number]   NDC: [address]-8

## 2019-12-02 ENCOUNTER — Ambulatory Visit (INDEPENDENT_AMBULATORY_CARE_PROVIDER_SITE_OTHER): Payer: BC Managed Care – PPO | Admitting: *Deleted

## 2019-12-02 ENCOUNTER — Other Ambulatory Visit: Payer: Self-pay | Admitting: *Deleted

## 2019-12-02 DIAGNOSIS — J309 Allergic rhinitis, unspecified: Secondary | ICD-10-CM | POA: Diagnosis not present

## 2019-12-02 NOTE — Telephone Encounter (Signed)
Error

## 2019-12-09 ENCOUNTER — Ambulatory Visit (INDEPENDENT_AMBULATORY_CARE_PROVIDER_SITE_OTHER): Payer: BC Managed Care – PPO

## 2019-12-09 DIAGNOSIS — J309 Allergic rhinitis, unspecified: Secondary | ICD-10-CM | POA: Diagnosis not present

## 2019-12-17 ENCOUNTER — Ambulatory Visit: Payer: BC Managed Care – PPO | Attending: Internal Medicine

## 2019-12-17 DIAGNOSIS — Z23 Encounter for immunization: Secondary | ICD-10-CM

## 2019-12-17 NOTE — Progress Notes (Signed)
   Covid-19 Vaccination Clinic  Name:  Karen Ortiz    MRN: [id number] DOB: 1976-02-24  12/17/2019  [name] was observed post Covid-19 immunization for 15 minutes without incident. She was provided with Vaccine Information Sheet and instruction to access the V-Safe system.   [name] was instructed to call 911 with any severe reactions post vaccine: Marland Kitchen Difficulty breathing  . Swelling of face and throat  . A fast heartbeat  . A bad rash all over body  . Dizziness and weakness   Immunizations Administered    Name Date Dose VIS Date Route   Pfizer COVID-19 Vaccine 12/17/2019 11:42 AM 0.3 mL 09/12/2019 Intramuscular   Manufacturer: [employer], [employer]   Lot: [id number]   NDC: [address]-0

## 2019-12-23 ENCOUNTER — Ambulatory Visit (INDEPENDENT_AMBULATORY_CARE_PROVIDER_SITE_OTHER): Payer: BC Managed Care – PPO

## 2019-12-23 DIAGNOSIS — J309 Allergic rhinitis, unspecified: Secondary | ICD-10-CM

## 2020-01-01 ENCOUNTER — Ambulatory Visit (INDEPENDENT_AMBULATORY_CARE_PROVIDER_SITE_OTHER): Payer: BC Managed Care – PPO | Admitting: *Deleted

## 2020-01-01 DIAGNOSIS — J309 Allergic rhinitis, unspecified: Secondary | ICD-10-CM

## 2020-01-08 ENCOUNTER — Ambulatory Visit (INDEPENDENT_AMBULATORY_CARE_PROVIDER_SITE_OTHER): Payer: BC Managed Care – PPO

## 2020-01-08 DIAGNOSIS — J309 Allergic rhinitis, unspecified: Secondary | ICD-10-CM

## 2020-01-15 ENCOUNTER — Ambulatory Visit (INDEPENDENT_AMBULATORY_CARE_PROVIDER_SITE_OTHER): Payer: BC Managed Care – PPO

## 2020-01-15 DIAGNOSIS — J309 Allergic rhinitis, unspecified: Secondary | ICD-10-CM | POA: Diagnosis not present

## 2020-01-22 ENCOUNTER — Ambulatory Visit (INDEPENDENT_AMBULATORY_CARE_PROVIDER_SITE_OTHER): Payer: BC Managed Care – PPO

## 2020-01-22 DIAGNOSIS — J309 Allergic rhinitis, unspecified: Secondary | ICD-10-CM

## 2020-01-29 ENCOUNTER — Ambulatory Visit (INDEPENDENT_AMBULATORY_CARE_PROVIDER_SITE_OTHER): Payer: BC Managed Care – PPO

## 2020-01-29 DIAGNOSIS — J309 Allergic rhinitis, unspecified: Secondary | ICD-10-CM | POA: Diagnosis not present

## 2020-02-19 ENCOUNTER — Ambulatory Visit (INDEPENDENT_AMBULATORY_CARE_PROVIDER_SITE_OTHER): Payer: BC Managed Care – PPO

## 2020-02-19 DIAGNOSIS — J309 Allergic rhinitis, unspecified: Secondary | ICD-10-CM | POA: Diagnosis not present

## 2020-03-09 ENCOUNTER — Ambulatory Visit (INDEPENDENT_AMBULATORY_CARE_PROVIDER_SITE_OTHER): Payer: BC Managed Care – PPO

## 2020-03-09 DIAGNOSIS — J309 Allergic rhinitis, unspecified: Secondary | ICD-10-CM

## 2020-04-22 ENCOUNTER — Ambulatory Visit: Payer: Self-pay

## 2020-04-22 ENCOUNTER — Telehealth: Payer: Self-pay

## 2020-04-22 NOTE — Telephone Encounter (Signed)
Patient came into get her allergy injection and was advise it has been 6+ weeks since her last injection. 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 Patient was advise that no injection will be given today. Last injection given was on 03/09/2020 Green vial 0.2 ml. Patient verbalized understanding and will wait for our call to determine where we should start. Dr. Nunzio Cobbs please advise.

## 2020-04-22 NOTE — Telephone Encounter (Signed)
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 Please call her to apologize. If she has not had problems with large local or systemic reactions to this point, she can receive 0.025 cc of Green vial and build up from there on schedule A. Thanks.

## 2020-04-23 NOTE — Telephone Encounter (Signed)
Left message to discuss with patient about restarting on her injections.

## 2020-04-23 NOTE — Telephone Encounter (Signed)
I did not intend to turn the patient away but was following protocol and I do understand that contacting the provider at hand would have been I deal. However considering I was handling other patients on a busy day did not have the time to leave the injection room or make a call to speak with the provider. I did mention the protocol to the patient and advised her that I would need to speak with a provider regarding her injection schedule. I asked the patient that if she was okay with me sending a message and she was okay with this, since she could not wait and we needed to handle this matter at hand giving a busy day. I would be glad to speak with the patient regarding this and let her know when we could get her back into the office.

## 2020-04-26 NOTE — Telephone Encounter (Signed)
Left message to call back or when she comes back  she can receive 0.025 cc of Green vial and build up from there on schedule A.

## 2020-04-26 NOTE — Telephone Encounter (Signed)
Understood. I suspected you weren't doing it just to be mean. Thanks for clarifying.

## 2020-05-07 DIAGNOSIS — J301 Allergic rhinitis due to pollen: Secondary | ICD-10-CM | POA: Diagnosis not present

## 2020-05-17 DIAGNOSIS — J3089 Other allergic rhinitis: Secondary | ICD-10-CM | POA: Diagnosis not present

## 2020-05-24 NOTE — Telephone Encounter (Signed)
Called patient and spoke with her just a few seconds.  Patient was at work.  Patient will call later at convenient time for her.

## 2020-06-09 ENCOUNTER — Other Ambulatory Visit: Payer: Self-pay | Admitting: Family Medicine

## 2020-06-09 DIAGNOSIS — Z1231 Encounter for screening mammogram for malignant neoplasm of breast: Secondary | ICD-10-CM

## 2020-10-29 ENCOUNTER — Other Ambulatory Visit: Payer: Self-pay

## 2020-10-29 ENCOUNTER — Ambulatory Visit
Admission: RE | Admit: 2020-10-29 | Discharge: 2020-10-29 | Disposition: A | Discharge status: Home / Self Care | Payer: BC Managed Care – PPO | Source: Ambulatory Visit | Attending: Family Medicine | Admitting: Family Medicine

## 2020-10-29 ENCOUNTER — Ambulatory Visit: Payer: Self-pay

## 2020-10-29 DIAGNOSIS — Z1231 Encounter for screening mammogram for malignant neoplasm of breast: Secondary | ICD-10-CM

## 2022-03-03 ENCOUNTER — Other Ambulatory Visit: Payer: Self-pay | Admitting: Family Medicine

## 2022-03-03 DIAGNOSIS — Z1231 Encounter for screening mammogram for malignant neoplasm of breast: Secondary | ICD-10-CM

## 2022-03-15 ENCOUNTER — Ambulatory Visit
Admission: RE | Admit: 2022-03-15 | Discharge: 2022-03-15 | Disposition: A | Discharge status: Home / Self Care | Payer: BC Managed Care – PPO | Source: Ambulatory Visit | Attending: Family Medicine | Admitting: Family Medicine

## 2022-03-15 DIAGNOSIS — Z1231 Encounter for screening mammogram for malignant neoplasm of breast: Secondary | ICD-10-CM

## 2022-03-17 ENCOUNTER — Other Ambulatory Visit: Payer: Self-pay | Admitting: Family Medicine

## 2022-03-17 DIAGNOSIS — R928 Other abnormal and inconclusive findings on diagnostic imaging of breast: Secondary | ICD-10-CM

## 2022-03-20 ENCOUNTER — Ambulatory Visit: Payer: BC Managed Care – PPO

## 2022-03-20 ENCOUNTER — Ambulatory Visit
Admission: RE | Admit: 2022-03-20 | Discharge: 2022-03-20 | Disposition: A | Discharge status: Home / Self Care | Payer: BC Managed Care – PPO | Source: Ambulatory Visit | Attending: Family Medicine | Admitting: Family Medicine

## 2022-03-20 DIAGNOSIS — R928 Other abnormal and inconclusive findings on diagnostic imaging of breast: Secondary | ICD-10-CM

## 2022-03-23 ENCOUNTER — Ambulatory Visit
Admission: RE | Admit: 2022-03-23 | Discharge: 2022-03-23 | Disposition: A | Discharge status: Home / Self Care | Payer: BC Managed Care – PPO | Source: Ambulatory Visit | Attending: Family Medicine | Admitting: Family Medicine

## 2022-03-23 ENCOUNTER — Ambulatory Visit: Admission: RE | Admit: 2022-03-23 | Payer: BC Managed Care – PPO | Source: Ambulatory Visit

## 2022-03-23 DIAGNOSIS — R928 Other abnormal and inconclusive findings on diagnostic imaging of breast: Secondary | ICD-10-CM

## 2023-04-04 ENCOUNTER — Ambulatory Visit (INDEPENDENT_AMBULATORY_CARE_PROVIDER_SITE_OTHER): Payer: BC Managed Care – PPO

## 2023-04-04 ENCOUNTER — Ambulatory Visit: Payer: BC Managed Care – PPO | Admitting: Podiatry

## 2023-04-04 DIAGNOSIS — M7671 Peroneal tendinitis, right leg: Secondary | ICD-10-CM

## 2023-04-04 DIAGNOSIS — M7672 Peroneal tendinitis, left leg: Secondary | ICD-10-CM | POA: Diagnosis not present

## 2023-04-04 MED ORDER — MELOXICAM 15 MG PO TABS: 15.0000 mg | ORAL_TABLET | Freq: Every day | ORAL | 1 refills | Status: AC

## 2023-04-04 MED ORDER — METHYLPREDNISOLONE 4 MG PO TBPK: ORAL_TABLET | ORAL | 0 refills | Status: AC

## 2023-04-04 NOTE — Progress Notes (Signed)
   Chief Complaint  Patient presents with   Foot Pain    B/L feet    HPI: 47 y.o. female presenting today as a new patient for evaluation of pain and tenderness associated to the lateral aspect of the bilateral feet as well as pain extending into the ankles.  Initially the pain began to the lateral aspect of the bilateral feet and she was seen January 2024 at [facility] orthopedics.  Cortisone injections were administered and she did feel some relief for a few weeks.  Slowly the pain returned.  Now she is experiencing pain throughout the entire foot and ankle bilaterally.  Presenting for further treatment and evaluation  Past Medical History:  Diagnosis Date   Gestational diabetes    Hx of varicella     Past Surgical History:  Procedure Laterality Date   WISDOM TOOTH EXTRACTION      No Known Allergies   Physical Exam: General: The patient is alert and oriented x3 in no acute distress.  Dermatology: Skin is warm, dry and supple bilateral lower extremities.   Vascular: Palpable pedal pulses bilaterally. Capillary refill within normal limits.  No appreciable edema.  No erythema.  Neurological: Grossly intact via light touch  Musculoskeletal Exam: No pedal deformities noted.  Diffuse pain throughout palpation bilaterally especially around the fifth metatarsal tubercle and the navicular tuberosity right greater than the left.  Radiographic Exam B/L feet 04/04/2023:  Normal osseous mineralization. Joint spaces preserved.  No fractures or osseous irregularities noted.  Hypertrophic navicular tuberosity noted to the right foot compared to the contralateral limb  Assessment/Plan of Care: 1.  Fifth metatarsal tubercle pain/insertional peroneal tendinitis bilateral 2.  Insertional PT tendinitis bilateral. RT > LT 3.  Generalized global pain bilateral feet and ankles  -Patient evaluated.  X-rays reviewed -Prescription for Medrol Dosepak -Prescription for meloxicam 15 mg daily after  completion of the Dosepak -Recommend good supportive tennis shoes and sneakers.  Advised against going barefoot.  Recommended shoes from [employer] running store -Patient does admit to going barefoot around the house.  Advised against this.  Recommend OOFOS slides -Return to clinic as needed       Felecia Shelling, DPM [facility]  Dr. Felecia Shelling, DPM    [address] [place], [place] [address]                Office [phone]  Fax [phone]

## 2023-05-16 ENCOUNTER — Ambulatory Visit: Payer: BC Managed Care – PPO | Admitting: Podiatry

## 2023-09-11 ENCOUNTER — Other Ambulatory Visit: Payer: Self-pay | Admitting: Family Medicine

## 2023-09-11 DIAGNOSIS — Z1231 Encounter for screening mammogram for malignant neoplasm of breast: Secondary | ICD-10-CM

## 2023-10-08 ENCOUNTER — Ambulatory Visit
Admission: RE | Admit: 2023-10-08 | Discharge: 2023-10-08 | Disposition: A | Discharge status: Home / Self Care | Payer: 59 | Source: Ambulatory Visit | Attending: Family Medicine | Admitting: Family Medicine

## 2023-10-08 DIAGNOSIS — Z1231 Encounter for screening mammogram for malignant neoplasm of breast: Secondary | ICD-10-CM
